# Patient Record
Sex: Male | Born: 1940 | Race: White | Hispanic: No | Marital: Married | Smoking: Never smoker
Health system: Southern US, Community
[De-identification: ages and names within clinical notes are randomized; demographics above are authoritative.]

## PROBLEM LIST (undated history)

## (undated) DIAGNOSIS — Z789 Other specified health status: Secondary | ICD-10-CM

## (undated) HISTORY — PX: PROSTATECTOMY: SHX69

## (undated) HISTORY — PX: HERNIA REPAIR: SHX51

---

## 2014-02-23 ENCOUNTER — Ambulatory Visit: Payer: Self-pay

## 2014-09-09 ENCOUNTER — Encounter
Admission: RE | Disposition: A | Discharge status: Home / Self Care | Payer: Self-pay | Source: Ambulatory Visit | Attending: Unknown Physician Specialty

## 2014-09-09 ENCOUNTER — Ambulatory Visit
Admission: RE | Admit: 2014-09-09 | Discharge: 2014-09-09 | Disposition: A | Discharge status: Home / Self Care | Payer: Medicare Other | Source: Ambulatory Visit | Attending: Unknown Physician Specialty | Admitting: Unknown Physician Specialty

## 2014-09-09 ENCOUNTER — Ambulatory Visit: Payer: Medicare Other | Admitting: Anesthesiology

## 2014-09-09 ENCOUNTER — Encounter: Payer: Self-pay | Admitting: *Deleted

## 2014-09-09 DIAGNOSIS — D125 Benign neoplasm of sigmoid colon: Secondary | ICD-10-CM | POA: Insufficient documentation

## 2014-09-09 DIAGNOSIS — Z86010 Personal history of colonic polyps: Secondary | ICD-10-CM | POA: Insufficient documentation

## 2014-09-09 DIAGNOSIS — Z09 Encounter for follow-up examination after completed treatment for conditions other than malignant neoplasm: Secondary | ICD-10-CM | POA: Diagnosis not present

## 2014-09-09 DIAGNOSIS — K640 First degree hemorrhoids: Secondary | ICD-10-CM | POA: Insufficient documentation

## 2014-09-09 DIAGNOSIS — Z7982 Long term (current) use of aspirin: Secondary | ICD-10-CM | POA: Diagnosis not present

## 2014-09-09 DIAGNOSIS — Z79899 Other long term (current) drug therapy: Secondary | ICD-10-CM | POA: Insufficient documentation

## 2014-09-09 HISTORY — PX: COLONOSCOPY WITH PROPOFOL: SHX5780

## 2014-09-09 SURGERY — COLONOSCOPY WITH PROPOFOL
Anesthesia: General

## 2014-09-09 MED ORDER — SODIUM CHLORIDE 0.9 % IV SOLN: INTRAVENOUS | Status: DC

## 2014-09-09 MED ORDER — PROPOFOL 10 MG/ML IV BOLUS
INTRAVENOUS | Status: DC | PRN
  Administered 2014-09-09: 30 mg via INTRAVENOUS

## 2014-09-09 MED ORDER — SODIUM CHLORIDE 0.9 % IV SOLN
INTRAVENOUS | Status: DC
  Administered 2014-09-09: 11:00:00 via INTRAVENOUS

## 2014-09-09 MED ORDER — FENTANYL CITRATE (PF) 100 MCG/2ML IJ SOLN
INTRAMUSCULAR | Status: DC | PRN
  Administered 2014-09-09: 50 ug via INTRAVENOUS

## 2014-09-09 MED ORDER — EPHEDRINE SULFATE 50 MG/ML IJ SOLN
INTRAMUSCULAR | Status: DC | PRN
  Administered 2014-09-09 (×2): 10 mg via INTRAVENOUS

## 2014-09-09 MED ORDER — LIDOCAINE HCL (PF) 2 % IJ SOLN
INTRAMUSCULAR | Status: DC | PRN
  Administered 2014-09-09: 50 mg

## 2014-09-09 MED ORDER — PROPOFOL INFUSION 10 MG/ML OPTIME
INTRAVENOUS | Status: DC | PRN
  Administered 2014-09-09: 120 ug/kg/min via INTRAVENOUS

## 2014-09-09 NOTE — H&P (Signed)
   Primary Care Physician:  Kirk Ruths., MD Primary Gastroenterologist:  Dr. Vira Agar  Pre-Procedure History & Physical: HPI:  Greogory Cornette is a 74 y.o. male is here for an colonoscopy.   No past medical history on file.  Past Surgical History  Procedure Laterality Date  . Hernia repair    . Prostatectomy      Prior to Admission medications   Medication Sig Start Date End Date Taking? Authorizing Provider  ascorbic acid (VITAMIN C) 1000 MG tablet Take 1,000 mg by mouth daily.   Yes Historical Provider, MD  aspirin EC 81 MG tablet Take 81 mg by mouth daily.   Yes Historical Provider, MD  Cholecalciferol (VITAMIN D3) 5000 UNITS CAPS Take by mouth.   Yes Historical Provider, MD  Cyanocobalamin (VITAMIN B 12 PO) Take by mouth.   Yes Historical Provider, MD  GARLIC PO Take 381 mg by mouth.   Yes Historical Provider, MD  Glucosamine Sulfate 1000 MG TABS Take by mouth.   Yes Historical Provider, MD  PLANT STANOL ESTER PO Take 450 mg by mouth.   Yes Historical Provider, MD    Allergies as of 08/03/2014  . (Not on File)    No family history on file.  Social History   Social History  . Marital Status: Married    Spouse Name: N/A  . Number of Children: N/A  . Years of Education: N/A   Occupational History  . Not on file.   Social History Main Topics  . Smoking status: Not on file  . Smokeless tobacco: Not on file  . Alcohol Use: Not on file  . Drug Use: Not on file  . Sexual Activity: Not on file   Other Topics Concern  . Not on file   Social History Narrative  . No narrative on file    Review of Systems: See HPI, otherwise negative ROS  Physical Exam: BP 130/91 mmHg  Pulse 65  Temp(Src) 97.1 F (36.2 C) (Tympanic)  Resp 20  Ht 5\' 10"  (1.778 m)  Wt 83.915 kg (185 lb)  BMI 26.54 kg/m2  SpO2 96% General:   Alert,  pleasant and cooperative in NAD Head:  Normocephalic and atraumatic. Neck:  Supple; no masses or thyromegaly. Lungs:  Clear throughout  to auscultation.    Heart:  Regular rate and rhythm. Abdomen:  Soft, nontender and nondistended. Normal bowel sounds, without guarding, and without rebound.   Neurologic:  Alert and  oriented x4;  grossly normal neurologically.  Impression/Plan: Otis Portal is here for an colonoscopy to be performed for [facility] colon polyps  Risks, benefits, limitations, and alternatives regarding  colonoscopy have been reviewed with the patient.  Questions have been answered.  All parties agreeable.   Gaylyn Cheers, MD  09/09/2014, 11:08 AM

## 2014-09-09 NOTE — Transfer of Care (Signed)
Immediate Anesthesia Transfer of Care Note  Patient: Jose Fowler  Procedure(s) Performed: Procedure(s): COLONOSCOPY WITH PROPOFOL (N/A)  Patient Location: PACU  Anesthesia Type:General  Level of Consciousness: sedated  Airway & Oxygen Therapy: Patient Spontanous Breathing and Patient connected to nasal cannula oxygen  Post-op Assessment: Report given to RN and Post -op Vital signs reviewed and stable  Post vital signs: Reviewed and stable  Last Vitals:  Filed Vitals:   09/09/14 1048  BP: 130/91  Pulse: 65  Temp: 36.2 C  Resp: 20    Complications: No apparent anesthesia complications

## 2014-09-09 NOTE — Op Note (Signed)
[facility] Patient Name: Jose Fowler Procedure Date: 09/09/2014 10:49 AM MRN: [id number] Account #: [id number] Date of Birth: [birth year] Admit Type: Outpatient Age: 74 Room: [facility] ENDO ROOM 1 Gender: Male Note Status: Finalized Procedure:         Colonoscopy Indications:       Personal history of colonic polyps Providers:         Manya Silvas, MD Referring MD:      Ocie Cornfield. Ouida Sills, MD (Referring MD) Medicines:         Propofol per Anesthesia Complications:     No immediate complications. Procedure:         Pre-Anesthesia Assessment:                    - After reviewing the risks and benefits, the patient was                     deemed in satisfactory condition to undergo the procedure.                    After obtaining informed consent, the colonoscope was                     passed under direct vision. Throughout the procedure, the                     patient's blood pressure, pulse, and oxygen saturations                     were monitored continuously. The Olympus PCF-H180AL                     colonoscope ( S#: [id number] ) was introduced through the                     anus and advanced to the the cecum, identified by                     appendiceal orifice and ileocecal valve. The colonoscopy                     was performed without difficulty. The patient tolerated                     the procedure well. The quality of the bowel preparation                     was excellent. Findings:      A diminutive polyp was found in the distal sigmoid colon. The polyp was       sessile. The polyp was removed with a jumbo cold forceps. Resection and       retrieval were complete.      Internal hemorrhoids were found during endoscopy. The hemorrhoids were       small and Grade I (internal hemorrhoids that do not prolapse).      A diffuse and patchy area of mildly scattered erythematous mucosal spots       was found in the rectum. Likely  related to prostate treatment in past.      The exam was otherwise without abnormality. Impression:        - One diminutive polyp in the distal sigmoid colon.  Resected and retrieved.                    - Internal hemorrhoids.                    - Erythematous mucosa in the rectum.                    - The examination was otherwise normal. Recommendation:    - Await pathology results. Manya Silvas, MD 09/09/2014 11:41:16 AM This report has been signed electronically. Number of Addenda: 0 Note Initiated On: 09/09/2014 10:49 AM Scope Withdrawal Time: 0 hours 13 minutes 1 second  Total Procedure Duration: 0 hours 22 minutes 33 seconds       [facility]

## 2014-09-10 ENCOUNTER — Encounter: Payer: Self-pay | Admitting: Unknown Physician Specialty

## 2014-09-10 LAB — SURGICAL PATHOLOGY

## 2014-09-10 NOTE — Anesthesia Preprocedure Evaluation (Signed)
Anesthesia Evaluation  Patient identified by MRN, date of birth, ID band Patient awake    Reviewed: Allergy & Precautions, H&P , NPO status , Patient's Chart, lab work & pertinent test results, reviewed documented beta blocker date and time   History of Anesthesia Complications Negative for: history of anesthetic complications  Airway Mallampati: I  TM Distance: >3 FB Neck ROM: full    Dental no notable dental hx. (+) Teeth Intact   Pulmonary neg pulmonary ROS,  breath sounds clear to auscultation  Pulmonary exam normal       Cardiovascular Exercise Tolerance: Good negative cardio ROS Normal cardiovascular examRhythm:regular Rate:Normal     Neuro/Psych negative neurological ROS  negative psych ROS   GI/Hepatic negative GI ROS, Neg liver ROS,   Endo/Other  negative endocrine ROS  Renal/GU negative Renal ROS  negative genitourinary   Musculoskeletal   Abdominal   Peds  Hematology negative hematology ROS (+)   Anesthesia Other Findings No past medical history on file.   Reproductive/Obstetrics negative OB ROS                             Anesthesia Physical Anesthesia Plan  ASA: I  Anesthesia Plan: General   Post-op Pain Management:    Induction:   Airway Management Planned:   Additional Equipment:   Intra-op Plan:   Post-operative Plan:   Informed Consent: I have reviewed the patients History and Physical, chart, labs and discussed the procedure including the risks, benefits and alternatives for the proposed anesthesia with the patient or authorized representative who has indicated his/her understanding and acceptance.   Dental Advisory Given  Plan Discussed with: Anesthesiologist, CRNA and Surgeon  Anesthesia Plan Comments:         Anesthesia Quick Evaluation

## 2014-09-10 NOTE — Anesthesia Postprocedure Evaluation (Signed)
  Anesthesia Post-op Note  Patient: Jose Fowler  Procedure(s) Performed: Procedure(s): COLONOSCOPY WITH PROPOFOL (N/A)  Anesthesia type:General  Patient location: PACU  Post pain: Pain level controlled  Post assessment: Post-op Vital signs reviewed, Patient's Cardiovascular Status Stable, Respiratory Function Stable, Patent Airway and No signs of Nausea or vomiting  Post vital signs: Reviewed and stable  Last Vitals:  Filed Vitals:   09/09/14 1210  BP: 131/85  Pulse: 68  Temp:   Resp: 14    Level of consciousness: awake, alert  and patient cooperative  Complications: No apparent anesthesia complications

## 2014-09-10 NOTE — Progress Notes (Signed)
Wife answered phone.  Patient still in bed but doing well post procedure.  Encouraged to call us back if she had any issues.

## 2015-05-30 ENCOUNTER — Emergency Department
Admission: EM | Admit: 2015-05-30 | Discharge: 2015-05-30 | Payer: Medicare Other | Attending: Emergency Medicine | Admitting: Emergency Medicine

## 2015-05-30 ENCOUNTER — Encounter: Payer: Self-pay | Admitting: Emergency Medicine

## 2015-05-30 ENCOUNTER — Emergency Department: Payer: Medicare Other

## 2015-05-30 DIAGNOSIS — S62609B Fracture of unspecified phalanx of unspecified finger, initial encounter for open fracture: Secondary | ICD-10-CM

## 2015-05-30 DIAGNOSIS — Z7982 Long term (current) use of aspirin: Secondary | ICD-10-CM | POA: Insufficient documentation

## 2015-05-30 DIAGNOSIS — Y9389 Activity, other specified: Secondary | ICD-10-CM | POA: Insufficient documentation

## 2015-05-30 DIAGNOSIS — Y999 Unspecified external cause status: Secondary | ICD-10-CM | POA: Diagnosis not present

## 2015-05-30 DIAGNOSIS — E785 Hyperlipidemia, unspecified: Secondary | ICD-10-CM | POA: Diagnosis not present

## 2015-05-30 DIAGNOSIS — S62633A Displaced fracture of distal phalanx of left middle finger, initial encounter for closed fracture: Secondary | ICD-10-CM | POA: Insufficient documentation

## 2015-05-30 DIAGNOSIS — Y929 Unspecified place or not applicable: Secondary | ICD-10-CM | POA: Diagnosis not present

## 2015-05-30 DIAGNOSIS — S62631A Displaced fracture of distal phalanx of left index finger, initial encounter for closed fracture: Secondary | ICD-10-CM | POA: Diagnosis not present

## 2015-05-30 DIAGNOSIS — S66812A Strain of other specified muscles, fascia and tendons at wrist and hand level, left hand, initial encounter: Secondary | ICD-10-CM

## 2015-05-30 DIAGNOSIS — S68119A Complete traumatic metacarpophalangeal amputation of unspecified finger, initial encounter: Secondary | ICD-10-CM

## 2015-05-30 DIAGNOSIS — Z79899 Other long term (current) drug therapy: Secondary | ICD-10-CM | POA: Diagnosis not present

## 2015-05-30 DIAGNOSIS — M66242 Spontaneous rupture of extensor tendons, left hand: Secondary | ICD-10-CM | POA: Diagnosis not present

## 2015-05-30 DIAGNOSIS — W268XXA Contact with other sharp object(s), not elsewhere classified, initial encounter: Secondary | ICD-10-CM | POA: Insufficient documentation

## 2015-05-30 DIAGNOSIS — S6992XA Unspecified injury of left wrist, hand and finger(s), initial encounter: Secondary | ICD-10-CM | POA: Diagnosis present

## 2015-05-30 LAB — BASIC METABOLIC PANEL
ANION GAP: 7 (ref 5–15)
BUN: 17 mg/dL (ref 6–20)
CO2: 29 mmol/L (ref 22–32)
Calcium: 9.9 mg/dL (ref 8.9–10.3)
Chloride: 102 mmol/L (ref 101–111)
Creatinine, Ser: 0.81 mg/dL (ref 0.61–1.24)
GFR calc Af Amer: >60 mL/min (ref >60)
GFR calc non Af Amer: >60 mL/min (ref >60)
GLUCOSE: 119 mg/dL — AB (ref 65–99)
POTASSIUM: 4.2 mmol/L (ref 3.5–5.1)
Sodium: 138 mmol/L (ref 135–145)

## 2015-05-30 LAB — CBC WITH DIFFERENTIAL/PLATELET
Basophils Absolute: 0 10*3/uL (ref 0–0.1)
Basophils Relative: 0 %
EOS PCT: 1 %
Eosinophils Absolute: 0 10*3/uL (ref 0–0.7)
HEMATOCRIT: 39.5 % — AB (ref 40.0–52.0)
Hemoglobin: 13.4 g/dL (ref 13.0–18.0)
LYMPHS PCT: 19 %
Lymphs Abs: 0.8 10*3/uL — ABNORMAL LOW (ref 1.0–3.6)
MCH: 30.9 pg (ref 26.0–34.0)
MCHC: 34 g/dL (ref 32.0–36.0)
MCV: 90.8 fL (ref 80.0–100.0)
MONO ABS: 0.2 10*3/uL (ref 0.2–1.0)
MONOS PCT: 6 %
NEUTROS ABS: 3 10*3/uL (ref 1.4–6.5)
Neutrophils Relative %: 74 %
PLATELETS: 126 10*3/uL — AB (ref 150–440)
RBC: 4.35 MIL/uL — ABNORMAL LOW (ref 4.40–5.90)
RDW: 13.7 % (ref 11.5–14.5)
WBC: 4.1 10*3/uL (ref 3.8–10.6)

## 2015-05-30 LAB — APTT: aPTT: 25 seconds (ref 24–36)

## 2015-05-30 LAB — PROTIME-INR
INR: 1.01
Prothrombin Time: 13.5 seconds (ref 11.4–15.0)

## 2015-05-30 MED ORDER — SODIUM CHLORIDE 0.9 % IV BOLUS (SEPSIS)
500.0000 mL | Freq: Once | INTRAVENOUS | Status: AC
  Administered 2015-05-30: 500 mL via INTRAVENOUS

## 2015-05-30 MED ORDER — CEFAZOLIN SODIUM-DEXTROSE 2-4 GM/100ML-% IV SOLN
2.0000 g | Freq: Once | INTRAVENOUS | Status: AC
  Administered 2015-05-30: 2 g via INTRAVENOUS
  Filled 2015-05-30: qty 100

## 2015-05-30 MED ORDER — HYDROMORPHONE HCL 1 MG/ML IJ SOLN
0.5000 mg | Freq: Once | INTRAMUSCULAR | Status: AC
  Administered 2015-05-30: 0.5 mg via INTRAVENOUS

## 2015-05-30 MED ORDER — HYDROMORPHONE HCL 1 MG/ML IJ SOLN
0.5000 mg | Freq: Once | INTRAMUSCULAR | Status: AC
  Administered 2015-05-30: 0.5 mg via INTRAVENOUS
  Filled 2015-05-30: qty 1

## 2015-05-30 MED ORDER — TETANUS-DIPHTH-ACELL PERTUSSIS 5-2.5-18.5 LF-MCG/0.5 IM SUSP
0.5000 mL | Freq: Once | INTRAMUSCULAR | Status: AC
  Administered 2015-05-30: 0.5 mL via INTRAMUSCULAR
  Filled 2015-05-30: qty 0.5

## 2015-05-30 MED ORDER — HYDROMORPHONE HCL 1 MG/ML IJ SOLN
INTRAMUSCULAR | Status: AC
  Administered 2015-05-30: 0.5 mg via INTRAVENOUS
  Filled 2015-05-30: qty 1

## 2015-05-30 NOTE — ED Notes (Signed)
Cut with table saw , partial finger amputation and multiple cuts, received at 1345.

## 2015-05-30 NOTE — ED Provider Notes (Signed)
[facility] Emergency Department Provider Note  ____________________________________________  Time seen: 3:15 PM  I have reviewed the triage vital signs and the nursing notes.   HISTORY  Chief Complaint Laceration    HPI Jose Fowler is a 75 y.o. male who sustained a table saw lacerations to his left hand fingers at about 1:45 PM. He was cutting a piece of wood with an ectopic, and the blade causing her to jerk suddenly pulling his fingers in front of the blade. He sustained partial amputation of the left ring finger and has multiple lacerations over the second and third fingers. Denies any other injuries. No hand pain or wrist pain.     History reviewed. No pertinent past medical history. Hyperlipidemia  There are no active problems to display for this patient.    Past Surgical History  Procedure Laterality Date  . Hernia repair    . Prostatectomy    . Colonoscopy with propofol N/A 09/09/2014    Procedure: COLONOSCOPY WITH PROPOFOL;  Surgeon: Manya Silvas, MD;  Location: [facility] ENDOSCOPY;  Service: Endoscopy;  Laterality: N/A;     Current Outpatient Rx  Name  Route  Sig  Dispense  Refill  . ascorbic acid (VITAMIN C) 1000 MG tablet   Oral   Take 1,000 mg by mouth daily.         Marland Kitchen aspirin EC 81 MG tablet   Oral   Take 81 mg by mouth daily.         . Cholecalciferol (VITAMIN D3) 5000 UNITS CAPS   Oral   Take by mouth.         . Cyanocobalamin (VITAMIN B 12 PO)   Oral   Take by mouth.         Marland Kitchen GARLIC PO   Oral   Take 600 mg by mouth.         . Glucosamine Sulfate 1000 MG TABS   Oral   Take by mouth.         Marland Kitchen PLANT STANOL ESTER PO   Oral   Take 450 mg by mouth.            Allergies Codeine; Imipramine; and Zoladex   No family history on file.  Social History Social History  Substance Use Topics  . Smoking status: Never Smoker   . Smokeless tobacco: None  . Alcohol Use: Yes    Review of  Systems  Constitutional:   No fever or chills.  Eyes:   No vision changes.  ENT:   No sore throat. No rhinorrhea. Cardiovascular:   No chest pain. Respiratory:   No dyspnea or cough. Gastrointestinal:   Negative for abdominal pain, vomiting and diarrhea.  Genitourinary:   Negative for dysuria or difficulty urinating. Musculoskeletal:   Severe pain in the left hand particularly the fourth finger but in the second and third fingers as well. Neurological:   Negative for headaches 10-point ROS otherwise negative.  ____________________________________________   PHYSICAL EXAM:  VITAL SIGNS: ED Triage Vitals  Enc Vitals Group     BP 05/30/15 1443 102/64 mmHg     Pulse Rate 05/30/15 1443 44     Resp 05/30/15 1443 18     Temp 05/30/15 1443 97.9 F (36.6 C)     Temp Source 05/30/15 1443 Oral     SpO2 05/30/15 1443 98 %     Weight 05/30/15 1443 180 lb (81.647 kg)     Height 05/30/15 1443 5\' 10"  (1.778 m)  Head Cir --      Peak Flow --      Pain Score 05/30/15 1443 10     Pain Loc --      Pain Edu? --      Excl. in [place]? --     Vital signs reviewed, nursing assessments reviewed.   Constitutional:   Alert and oriented. Well appearing and in no distress. Eyes:   No scleral icterus. No conjunctival pallor. PERRL. EOMI.  No nystagmus. ENT   Head:   Normocephalic and atraumatic.   Nose:   No congestion/rhinnorhea. No septal hematoma   Mouth/Throat:   MMM, no pharyngeal erythema. No peritonsillar mass.    Neck:   No stridor. No SubQ emphysema. No meningismus. Hematological/Lymphatic/Immunilogical:   No cervical lymphadenopathy. Cardiovascular:   RRR. Symmetric bilateral radial and DP pulses.  No murmurs.  Respiratory:   Normal respiratory effort without tachypnea nor retractions. Breath sounds are clear and equal bilaterally. No wheezes/rales/rhonchi. Gastrointestinal:   Soft and nontender. Non distended. There is no CVA tenderness.  No rebound, rigidity, or  guarding. Genitourinary:   deferred Musculoskeletal:   Left hand has an obvious partial amputation of the distal phalanx of the left fourth finger. There is exposed bone at the laceration service which is jagged and irregular. Bleeding is controlled. No other apparent injuries to the fourth finger, range of motion at the PIP joint appears to be intact. The left third finger has multiple lacerations over the proximal middle and distal phalanges. Nailbed is uninjured. There is extensor tendon weakness at the DIP joint. Left second finger has lacerations over the DIP joint and ulnar aspect of the nailbed with intact range of motion. Good distal perfusion in all fingers. The thumb and the fifth finger appear to be uninjured. Left upper extremity is uninjured above the wrist. Neurologic:   Normal speech and language.  CN 2-10 normal. Motor grossly intact. No gross focal neurologic deficits are appreciated.  Skin:    Multiple lacerations as above, no other injuries. ____________________________________________    LABS (pertinent positives/negatives) (all labs ordered are listed, but only abnormal results are displayed) Labs Reviewed  BASIC METABOLIC PANEL  CBC WITH DIFFERENTIAL/PLATELET  PROTIME-INR  APTT   ____________________________________________   EKG    ____________________________________________    RADIOLOGY  X-ray left hand reveals traumatic amputation of the fourth distal phalanx and a portion of the fourth middle phalanx with surrounding soft tissues. There does appear to be a remaining bone fragment of the still phalanx embedded in the soft tissues. Multiple fractures of the second middle and distal phalanges.  ____________________________________________   PROCEDURES CRITICAL CARE Performed by: Joni Fears, Edythe Riches   Total critical care time: 35 minutes  Critical care time was exclusive of separately billable procedures and treating other patients.  Critical care  was necessary to treat or prevent imminent or life-threatening deterioration.  Critical care was time spent personally by me on the following activities: development of treatment plan with patient and/or surrogate as well as nursing, discussions with consultants, evaluation of patient's response to treatment, examination of patient, obtaining history from patient or surrogate, ordering and performing treatments and interventions, ordering and review of laboratory studies, ordering and review of radiographic studies, pulse oximetry and re-evaluation of patient's condition.  ____________________________________________   INITIAL IMPRESSION / ASSESSMENT AND PLAN / ED COURSE  Pertinent labs & imaging results that were available during my care of the patient were reviewed by me and considered in my medical decision making (see chart  for details).  Patient presents with partial amputation of the left fourth finger as well as multiple open fractures of the second finger and likely third finger with also extensor tendon rupture of the third finger consistent with [place] finger. Multiple substantial traumatic injuries to the hand. Patient is without a concern that their HMO insurance may not cover care at [facility], but spoke with the representative myself and they assure [place] that for this acute care management especially since this requires specialized service that only do can provide, it would be covered so we'll proceed with transfer to [facility].  ----------------------------------------- 4:15 PM on 05/30/2015 -----------------------------------------  Case discussed with Dr. Jarold Song of [facility], except's for transfer, ED to ED via [facility].  Tdap have given, 2 g of Ancef IV, nothing by mouth.     ____________________________________________   FINAL CLINICAL IMPRESSION(S) / ED DIAGNOSES  Final diagnoses:  Amputation of finger of left hand with complication, initial encounter   Extensor tendon rupture of hand, left, initial encounter  Fracture of fingers, multiple sites, open, initial encounter       Portions of this note were generated with dragon dictation software. Dictation errors may occur despite best attempts at proofreading.   Carrie Mew, MD 05/30/15 [phone]

## 2015-06-17 ENCOUNTER — Ambulatory Visit: Payer: Medicare Other | Attending: Physician Assistant | Admitting: Occupational Therapy

## 2015-06-17 ENCOUNTER — Encounter: Payer: Self-pay | Admitting: Occupational Therapy

## 2015-06-17 DIAGNOSIS — S68625D 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, subsequent encounter: Secondary | ICD-10-CM | POA: Insufficient documentation

## 2015-06-17 DIAGNOSIS — R609 Edema, unspecified: Secondary | ICD-10-CM | POA: Diagnosis present

## 2015-06-17 DIAGNOSIS — R278 Other lack of coordination: Secondary | ICD-10-CM | POA: Diagnosis present

## 2015-06-17 DIAGNOSIS — S61209D Unspecified open wound of unspecified finger without damage to nail, subsequent encounter: Secondary | ICD-10-CM | POA: Diagnosis present

## 2015-06-17 DIAGNOSIS — S62621D Displaced fracture of medial phalanx of left index finger, subsequent encounter for fracture with routine healing: Secondary | ICD-10-CM | POA: Diagnosis not present

## 2015-06-17 DIAGNOSIS — R600 Localized edema: Secondary | ICD-10-CM

## 2015-06-17 DIAGNOSIS — X58XXXD Exposure to other specified factors, subsequent encounter: Secondary | ICD-10-CM | POA: Diagnosis not present

## 2015-06-17 DIAGNOSIS — M6281 Muscle weakness (generalized): Secondary | ICD-10-CM | POA: Diagnosis present

## 2015-06-17 NOTE — Therapy (Signed)
[place] [facility] [address] [place], [address] Phone: [phone]   Fax:  [phone]  Occupational Therapy Evaluation  Patient Details  Name: Jose Fowler MRN: [id number] Date of Birth: [birth year] Referring Provider: Dr Hassell Halim  Encounter Date: 06/17/2015      OT End of Session - 06/17/15 1258    Visit Number 1   Number of Visits 12   Date for OT Re-Evaluation 07/29/15   Authorization Type UHC Medicare   OT Start Time 1042   OT Stop Time 1129   OT Time Calculation (min) 47 min   Activity Tolerance Patient tolerated treatment well   Behavior During Therapy [facility] for tasks assessed/performed      History reviewed. No pertinent past medical history.  Past Surgical History  Procedure Laterality Date  . Hernia repair    . Prostatectomy    . Colonoscopy with propofol N/A 09/09/2014    Procedure: COLONOSCOPY WITH PROPOFOL;  Surgeon: Manya Silvas, MD;  Location: [facility] ENDOSCOPY;  Service: Endoscopy;  Laterality: N/A;    There were no vitals filed for this visit.      Subjective Assessment - 06/17/15 1047    Subjective  Pt reports that he was at home and cutting some wood when a piece of wood "broke off and I think I moved and turned and" and sustained L LF extensor tendon repair on 05/30/15; IF middle phalanx fracture and partial amputation  of RF DIP. He presents today in custom fabricated splint left hand as fabricated at [facility] for "Begin gentle AROM of RF PIP nd Index PIP, Hold off on index finger ROM".    Patient Stated Goals Pt states that his goal is to get use of left hand back, motion and functional use.    Currently in Pain? Yes   Pain Score 5    Pain Location Hand   Pain Orientation Left   Pain Descriptors / Indicators Throbbing   Pain Type Acute pain   Pain Onset 1 to 4 weeks ago   Pain Frequency Occasional   Aggravating Factors  bumping his hand; positioning straps   Pain Relieving  Factors Pain medication helps; positioning left hand    Multiple Pain Sites No           [facility] OT Assessment - 06/17/15 0001    Assessment   Diagnosis S/P table saw injury to left hand which occurred May 30, 2015. Injuries to left hand include distal phalanx amputation ring finger, open fracture base of middle phalanx long finger with extensor tendon laceration, open comminuted fracture middle phalanx and distal phalanx index finger. He was seen in ED of [facility] & transferred to [facility] on 05/30/15 & underwent revision amputation ring finger, wound repair and repair extensor tendon long finger, wound repair including nail bed repair index finger   Referring Provider Dr Hassell Halim   Onset Date 05/30/15   Prior Therapy Hand therapy at [facility] for fabrication of protective splint s/p extensor tendon repair, multiple fractures and partial amputation.   Precautions   Precautions Other (comment)  No AROM Left Index Finger DIP; seondary to nailbed /fracture   Required Braces or Orthoses Other Brace/Splint  Custom fabricated left resting pan splint at [facility]. MP's ~35*   Other Brace/Splint Protective splint at all times, removing only for wound care/dressing changes and home program as outlined today in clinic. No functional use left non-dominant hand at this time.   Pt/spouse verbalized understanding of  this in clinic today.   Home  Environment   Family/patient expects to be discharged to: Private residence   Type of [place]   Level of Independence Independent   Vocation Retired   Agricultural engineer, building wood scale models, reading.   Observation/Other Assessments   Observations Pt/spouse report indeendence w/ dressing changes & splint wear and care as previously fabricated and issued at [facility].   Other Surveys  Select   Quick DASH  61.4% disability  0% = no disability   Sensation   Light Touch Not tested  Secondary to dressings on left IF, MF, RF    Additional Comments Pt reports some phantom pain and paresthesias left RF noted   Coordination   Gross Motor Movements are Fluid and Coordinated No   Fine Motor Movements are Fluid and Coordinated No   Coordination and Movement Description No AROM or functional use left hand at this time secondary to surgical repairs/tendon/fracture healing.   Coordination Impaired/unable to assess at this time   Edema   Edema Moderate edema noted L dorsal hand/digits   ROM / Strength   AROM / PROM / Strength AROM   Left Hand AROM   L Index PIP 0-100 32 Degrees  Blocked 0-32* measured over gauze only, w/o finger dressing   L Index DIP 0-70 --  NT - Stressed NO ROM DIPJ Left IF   L Ring PIP 0-100 40 Degrees  Blocked ROM 0-40* measured over gauze (w/o finger sleeve)   L Ring DIP 0-70 --  N/A s/p amputation/revision                  OT Treatments/Exercises (OP) - 06/17/15 0001    ADLs   ADL Comments Pt/spouse were instructed in no functional use left hand at this time secondary to tendon repairs, fractures and wound healing. He was eduacted to wear prtective splint at all times and remove only as instructed by MD for dressing changes and wound care. Pt/spouse were educated in possible signs and symptoms of infection and were educated to call MD office immeadiately should he experience any of those symtpoms. Note,pt spouse is a retired Marine scientist and has wound care experience per their report.   ADL Education Given --  Do not use left hand for ADL's/IADL's at this time. No Use   Exercises   Exercises Hand   Hand Exercises   Joint Blocking Exercises Blocked flexion of PIP to RF and IF only. Stressed no ROM to IF DIP at this time 2* fracture/nailbed healing and No  ROM to LF as well secondary to extensor tendon healing/protocol.   Pt/spouse verbalized understanding in clinic at this time               OT Education - 06/17/15 1255    Education provided Yes   Education Details See patient  instruction. signs/symptoms of possible infection, active blocked PIP flexion to RF, IF. No AROM DIP of IF and no AROM LF. Wear splint inbetween dressing changes and ex's. No functional use left hand at this time. Monitor for IF extensor lag and d/c any exercises if you notice this.    Person(s) Educated Patient;Spouse   Methods Explanation;Demonstration;Verbal cues;Tactile cues;Handout   Comprehension Verbalized understanding;Verbal cues required;Returned demonstration;Tactile cues required          OT Short Term Goals - 06/17/15 1308    OT SHORT TERM GOAL #1   Title Pt will be  I protective splinting use, care and precautions left UE   Time 4   Period Weeks   Status New   OT SHORT TERM GOAL #2   Title Pt will be Mod I HEP as per tendon, fracture and wound healing left hand   Time 4   Period Weeks   Status New   OT SHORT TERM GOAL #3   Title Pt will be Mod I stating edema control techniques L hand   Period Weeks   Status New           OT Long Term Goals - 06/17/15 1310    OT LONG TERM GOAL #1   Title Pt will be Mod I upgraded HEP L hand   Time 8   Period Weeks   Status New   OT LONG TERM GOAL #2   Title Pt will demonstrate AROM for digital flexion, extension and composite flexion/extension WFL's (w/o extensor lag noted left hand).   Time 8   Period Weeks   Status New   OT LONG TERM GOAL #3   Title Pt will be Mod I left non-dominant hand use for ADL' and IADL's   Time 8   Period Weeks   Status New   OT LONG TERM GOAL #4   Title Pt will be mod I scar management and desensitization techniques left hand/fingers   Time 8   Period Weeks   Status New   OT LONG TERM GOAL #5   Title Pt will demonstrate left hand grip & pinch WFL's, as compared to right dominant hand, for return to daily activities.   Time 8   Period Weeks   Status New               Plan - 06/17/15 1259    Clinical Impression Statement Pt is a 75 y/o RHD male s/p  table saw injury to left hand  which occurred May 30, 2015. Injuries to left hand include distal phalanx amputation ring finger, open fracture base of middle phalanx long finger with extensor tendon laceration, open comminuted fracture middle phalanx and distal phalanx index finger. He was initially seen in ED of [facility] & transferred to [facility] on 05/30/15 & underwent revision amputation ring finger, wound repair and repair extensor tendon long finger, wound repair including nail bed repair index finger. He presents today for out-pt OT/hand therapy in custom fabricated splint left hand as made by therapist at [facility]. Splint is to be worn at all times and removed only for dressing changes, ex's as instructed today. He is avoid any functional use left hand. He should benefit from out-pt OT to address edema, need for pt/spouse education and progress program as per tendon, fracture, & wound healing .   Rehab Potential Good   OT Frequency 2x / week   OT Duration 8 weeks   OT Treatment/Interventions Self-care/ADL training;Therapeutic exercise;Patient/family education;Splinting;Manual Therapy;Ultrasound;Therapeutic exercises;Therapeutic activities;DME and/or AE instruction;Parrafin;Fluidtherapy;Scar mobilization;Passive range of motion;Moist Heat;Cryotherapy   Plan Review HEP left RF & IF; edema control, review precautions, dressing change PRN.   OT Home Exercise Plan See pt instructions   Consulted and Agree with Plan of Care Patient      Patient will benefit from skilled therapeutic intervention in order to improve the following deficits and impairments:  Decreased coordination, Decreased range of motion, Impaired flexibility, Impaired sensation, Increased edema, Decreased activity tolerance, Decreased skin integrity, Pain, Impaired UE functional use, Decreased scar mobility, Decreased mobility, Decreased strength  Visit Diagnosis: Open displaced fracture of  middle phalanx of left index finger, with routine healing, subsequent encounter - Plan: Ot  plan of care cert/re-cert  Open wound of finger with tendon injury, subsequent encounter - Plan: Ot plan of care cert/re-cert  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, subsequent encounter - Plan: Ot plan of care cert/re-cert  Edema of hand - Plan: Ot plan of care cert/re-cert  Other lack of coordination - Plan: Ot plan of care cert/re-cert  Muscle weakness (generalized) - Plan: Ot plan of care cert/re-cert      G-Codes - 0000000 1319    Functional Assessment Tool Used Quick DASH   Functional Limitation Self care   Self Care Current Status [id number]) At least 60 percent but less than 80 percent impaired, limited or restricted   Self Care Goal Status [id number]) At least 1 percent but less than 20 percent impaired, limited or restricted      Problem List There are no active problems to display for this patient.   Carlynn Herald, Taeler Winning Beth Dixon, OTR/L 06/17/2015, 1:29 PM  [place] [facility] [address] [place], [address] Phone: [phone]   Fax:  [phone]  Name: Cobey Caballeros MRN: [id number] Date of Birth: 11-24-1940

## 2015-06-17 NOTE — Patient Instructions (Addendum)
PIP Flexion (Active Blocked)  DO not grasp cap as shown in picture. Only do exercises as performed in clinic today. D/c any exercise to IF if you notice lag of DIP.  1) Hold large knuckle straight using other hand. Bend middle joint of _Ring____ finger as far as possible. Hold _3-5___ seconds. Repeat __5-8__ times. Do _2-3___ sessions per day.  2) Hold large knuckle straight using other hand. Bend middle joint of Index finger as far as possible (take care to NOT bend the tip joint of the index finger - leave it straight). Hold 3-4 seconds repeat 5-8 times 2-3 times per day.  Copyright  VHI. All rights reserved. 3) Reapply protective splint and wear at all times in between exercise sessions. Do not use you left hand for any activity at this time.

## 2015-06-21 ENCOUNTER — Encounter: Payer: Self-pay | Admitting: Occupational Therapy

## 2015-06-21 ENCOUNTER — Ambulatory Visit: Payer: Medicare Other | Admitting: Occupational Therapy

## 2015-06-21 DIAGNOSIS — R278 Other lack of coordination: Secondary | ICD-10-CM

## 2015-06-21 DIAGNOSIS — S61209D Unspecified open wound of unspecified finger without damage to nail, subsequent encounter: Secondary | ICD-10-CM

## 2015-06-21 DIAGNOSIS — S68625D 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, subsequent encounter: Secondary | ICD-10-CM

## 2015-06-21 DIAGNOSIS — M6281 Muscle weakness (generalized): Secondary | ICD-10-CM

## 2015-06-21 DIAGNOSIS — R600 Localized edema: Secondary | ICD-10-CM

## 2015-06-21 DIAGNOSIS — S62621D Displaced fracture of medial phalanx of left index finger, subsequent encounter for fracture with routine healing: Secondary | ICD-10-CM

## 2015-06-21 NOTE — Therapy (Signed)
[place] [facility] [address] [place], [address] Phone: [phone]   Fax:  [phone]  Occupational Therapy Treatment  Patient Details  Name: Jose Fowler MRN: [id number] Date of Birth: July 22, 1940 Referring Provider: Dr Hassell Halim  Encounter Date: 06/21/2015      OT End of Session - 06/21/15 0810    Visit Number 2   Number of Visits 12   Date for OT Re-Evaluation 07/29/15   Authorization Type UHC Medicare   OT Start Time 0730   OT Stop Time 0808   OT Time Calculation (min) 38 min   Activity Tolerance Patient tolerated treatment well   Behavior During Therapy [facility] for tasks assessed/performed      History reviewed. No pertinent past medical history.  Past Surgical History  Procedure Laterality Date  . Hernia repair    . Prostatectomy    . Colonoscopy with propofol N/A 09/09/2014    Procedure: COLONOSCOPY WITH PROPOFOL;  Surgeon: Manya Silvas, MD;  Location: [facility] ENDOSCOPY;  Service: Endoscopy;  Laterality: N/A;    There were no vitals filed for this visit.      Subjective Assessment - 06/21/15 0733    Subjective  Pt reports increased pain to left RF, phantom nerve pain @ 4/10. He also states "I need a review of the exercises"   Patient is accompained by: Family member  wife   Patient Stated Goals Pt states that his goal is to get use of left hand back, motion and functional use.    Currently in Pain? Yes   Pain Score 4    Pain Location Hand   Pain Orientation Left   Pain Descriptors / Indicators Throbbing;Burning   Pain Type Acute pain   Pain Onset 1 to 4 weeks ago   Pain Frequency Intermittent   Multiple Pain Sites No            [facility] OT Assessment - 06/21/15 0001    Left Hand AROM   L Index PIP 0-100 48 Degrees   L Ring PIP 0-100 66 Degrees   L Little  MCP 0-90 36 Degrees   L Little PIP 0-100 72 Degrees   L Little DIP 0-70 48 Degrees                  OT  Treatments/Exercises (OP) - 06/21/15 0001    Exercises   Exercises Hand   Hand Exercises   Joint Blocking Exercises Blocked flexion of PIP to RF and IF only. Stressed no ROM to IF DIP at this time 2* fracture/nailbed healing & to wear DIP exten splint as fabricated today while performing PIP flexion ex's. No  ROM to LF as well secondary to extensor tendon healing/protocol. Ex's performed x10 reps each Left small, (PIP, DIP, MP), RF PIP and IF PIP.  Added blocked flexion ex's to left small finger.   Splinting   Splinting Left IF custom DIP extension splint was fabricated for use during active PIP flexion exercises as pt is not to have DIP ROM at this time. Pt was educated in splinting use, care and precautions. He will wear only when doing HEP at this time.                OT Education - 06/21/15 0810    Education provided Yes   Education Details HEP, spmint wear and care & use of IF DIP extension splint while performing PIP blocked ex's   Person(s) Educated Patient;Spouse  Methods Explanation;Demonstration;Verbal cues;Tactile cues   Comprehension Verbalized understanding;Returned demonstration;Tactile cues required;Verbal cues required          OT Short Term Goals - 06/17/15 1308    OT SHORT TERM GOAL #1   Title Pt will be I protective splinting use, care and precautions left UE   Time 4   Period Weeks   Status New   OT SHORT TERM GOAL #2   Title Pt will be Mod I HEP as per tendon, fracture and wound healing left hand   Time 4   Period Weeks   Status New   OT SHORT TERM GOAL #3   Title Pt will be Mod I stating edema control techniques L hand   Period Weeks   Status New           OT Long Term Goals - 06/17/15 1310    OT LONG TERM GOAL #1   Title Pt will be Mod I upgraded HEP L hand   Time 8   Period Weeks   Status New   OT LONG TERM GOAL #2   Title Pt will demonstrate AROM for digital flexion, extension and composite flexion/extension WFL's (w/o extensor lag  noted left hand).   Time 8   Period Weeks   Status New   OT LONG TERM GOAL #3   Title Pt will be Mod I left non-dominant hand use for ADL' and IADL's   Time 8   Period Weeks   Status New   OT LONG TERM GOAL #4   Title Pt will be mod I scar management and desensitization techniques left hand/fingers   Time 8   Period Weeks   Status New   OT LONG TERM GOAL #5   Title Pt will demonstrate left hand grip & pinch WFL's, as compared to right dominant hand, for return to daily activities.   Time 8   Period Weeks   Status New               Plan - 06/21/15 [id number]    Clinical Impression Statement Pt should benefit from use of volar DIP extension splint L IF while performing blocked PIP exercises. Nice gains in active blocked ROM RF and IF PIP noted. Added blcoked ex's for small finger as per pt reports of joint stiffness today. Pt/spouse c/o nerve and phantom pain L RF and hand & he may benefit from  f/u w/ MD as he states "Pain meds, don't touch it"    Rehab Potential Good   OT Frequency 2x / week   OT Duration 8 weeks   OT Treatment/Interventions Self-care/ADL training;Therapeutic exercise;Patient/family education;Splinting;Manual Therapy;Ultrasound;Therapeutic exercises;Therapeutic activities;DME and/or AE instruction;Parrafin;Fluidtherapy;Scar mobilization;Passive range of motion;Moist Heat;Cryotherapy   Plan Review HEP and spint use, edema control.   OT Home Exercise Plan See pt instructions   Consulted and Agree with Plan of Care Patient      Patient will benefit from skilled therapeutic intervention in order to improve the following deficits and impairments:  Decreased coordination, Decreased range of motion, Impaired flexibility, Impaired sensation, Increased edema, Decreased activity tolerance, Decreased skin integrity, Pain, Impaired UE functional use, Decreased scar mobility, Decreased mobility, Decreased strength  Visit Diagnosis: Open displaced fracture of middle phalanx  of left index finger, with routine healing, subsequent encounter  Open wound of finger with tendon injury, subsequent encounter  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   Carlynn Herald, Vilda Zollner Beth Dixon, OTR/L 06/21/2015, 11:01 AM  [place] [facility] [address] [place], [address] Phone: [phone]   Fax:  [phone]  Name: Jose Fowler MRN: [id number] Date of Birth: 22-Feb-1940

## 2015-06-24 ENCOUNTER — Encounter: Payer: Self-pay | Admitting: Occupational Therapy

## 2015-06-24 ENCOUNTER — Ambulatory Visit: Payer: Medicare Other | Attending: Physician Assistant | Admitting: Occupational Therapy

## 2015-06-24 DIAGNOSIS — X58XXXD Exposure to other specified factors, subsequent encounter: Secondary | ICD-10-CM | POA: Insufficient documentation

## 2015-06-24 DIAGNOSIS — M6281 Muscle weakness (generalized): Secondary | ICD-10-CM | POA: Diagnosis present

## 2015-06-24 DIAGNOSIS — R609 Edema, unspecified: Secondary | ICD-10-CM | POA: Insufficient documentation

## 2015-06-24 DIAGNOSIS — R278 Other lack of coordination: Secondary | ICD-10-CM | POA: Diagnosis present

## 2015-06-24 DIAGNOSIS — S62621D Displaced fracture of medial phalanx of left index finger, subsequent encounter for fracture with routine healing: Secondary | ICD-10-CM

## 2015-06-24 DIAGNOSIS — M25642 Stiffness of left hand, not elsewhere classified: Secondary | ICD-10-CM | POA: Diagnosis present

## 2015-06-24 DIAGNOSIS — R600 Localized edema: Secondary | ICD-10-CM

## 2015-06-24 DIAGNOSIS — S68625D 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, subsequent encounter: Secondary | ICD-10-CM

## 2015-06-24 DIAGNOSIS — S61209D Unspecified open wound of unspecified finger without damage to nail, subsequent encounter: Secondary | ICD-10-CM | POA: Diagnosis present

## 2015-06-24 NOTE — Therapy (Signed)
[place] [facility] [address] [place], [address] Phone: [phone]   Fax:  [phone]  Occupational Therapy Treatment  Patient Details  Name: Jose Fowler MRN: [id number] Date of Birth: 1940/02/26 Referring Provider: Dr Hassell Halim  Encounter Date: 06/24/2015      OT End of Session - 06/24/15 0958    Visit Number 3   Number of Visits 12   Date for OT Re-Evaluation 07/29/15   Authorization Type [employer] Medicare   Authorization - Visit Number --  3/10 G   OT Start Time 0916   OT Stop Time 0947   OT Time Calculation (min) 31 min   Activity Tolerance Patient tolerated treatment well   Behavior During Therapy [facility] for tasks assessed/performed      History reviewed. No pertinent past medical history.  Past Surgical History  Procedure Laterality Date  . Hernia repair    . Prostatectomy    . Colonoscopy with propofol N/A 09/09/2014    Procedure: COLONOSCOPY WITH PROPOFOL;  Surgeon: Manya Silvas, MD;  Location: [facility] ENDOSCOPY;  Service: Endoscopy;  Laterality: N/A;    There were no vitals filed for this visit.      Subjective Assessment - 06/24/15 0928    Subjective  Pt reports decreased pain and independence in splinting use at L IF DIP joint while performing PIP flexion exercises.    Patient is accompained by: Family member  wife   Patient Stated Goals Pt states that his goal is to get use of left hand back, motion and functional use.    Currently in Pain? Yes   Pain Score 1    Pain Location Hand   Pain Orientation Left   Pain Descriptors / Indicators Aching   Pain Type Acute pain   Pain Onset 1 to 4 weeks ago   Pain Frequency Intermittent   Multiple Pain Sites No            [facility] OT Assessment - 06/24/15 0001    Left Hand AROM   L Index PIP 0-100 55 Degrees   L Ring PIP 0-100 66 Degrees   L Little  MCP 0-90 74 Degrees   L Little PIP 0-100 85 Degrees   L Little DIP 0-70 60 Degrees                  OT Treatments/Exercises (OP) - 06/24/15 0001    Exercises   Exercises Hand   Hand Exercises   Joint Blocking Exercises Blocked flexion of PIP to RF and IF only. Stressed no ROM to IF DIP at this time 2* fracture/nailbed healing & to wear DIP exten splint as fabricated today while performing PIP flexion ex's. No  ROM to MF as well secondary to extensor tendon healing/protocol. Ex's performed x10 reps each Left small, (PIP, DIP, MP), RF PIP and IF PIP.  Aal ex's performed x10 reps each.    Other Hand Exercises Added AROM to Left shoulder and elbow for flexion, extension and general AROM as pt tends to keep L UE in protected position.   Pt/spouse verbalized understanding in clinic today.   Other Hand Exercises Gentle blocked flexion ex's Left small finger today x10 reps each MCP, PIP, DIP by therapist in clinc  Blocked ROM asssessed after treatment today (Not AROM)   Splinting   Splinting Pt is I don/doff and use of splint L IF DIP as noted today in clinic during blocked PIP ex's   Manual  Therapy   Manual therapy comments Pt inquired about putting lotion on MF where he has developed healing scar w/ scabs. Pt was advised against this. Wound/scar healing was reviewed w/ pt/spouse. Pt spouse is retired Therapist, sports and verbalized understanding and agreement with this "I told him the same thing" per pr wife. Wound check - L IF ulnar side of DIP w/ sm area w/ serosanguinous (pin hole) drainage. All other digits are well healing, w/o signs of infection. Redressed with finger gauze & stockinette. Pt family is performing  dressing changes every other day. Reviewed signs and symptoms of possible infection.                OT Education - 06/24/15 0957    Education provided Yes   Education Details Reviewed HEP, splint wear and care & use of DIP extension splint while performing PIP blcoked ex's.   Person(s) Educated Patient;Spouse   Methods Explanation;Demonstration;Verbal cues    Comprehension Verbalized understanding;Returned demonstration          OT Short Term Goals - 06/17/15 1308    OT SHORT TERM GOAL #1   Title Pt will be I protective splinting use, care and precautions left UE   Time 4   Period Weeks   Status New   OT SHORT TERM GOAL #2   Title Pt will be Mod I HEP as per tendon, fracture and wound healing left hand   Time 4   Period Weeks   Status New   OT SHORT TERM GOAL #3   Title Pt will be Mod I stating edema control techniques L hand   Period Weeks   Status New           OT Long Term Goals - 06/17/15 1310    OT LONG TERM GOAL #1   Title Pt will be Mod I upgraded HEP L hand   Time 8   Period Weeks   Status New   OT LONG TERM GOAL #2   Title Pt will demonstrate AROM for digital flexion, extension and composite flexion/extension WFL's (w/o extensor lag noted left hand).   Time 8   Period Weeks   Status New   OT LONG TERM GOAL #3   Title Pt will be Mod I left non-dominant hand use for ADL' and IADL's   Time 8   Period Weeks   Status New   OT LONG TERM GOAL #4   Title Pt will be mod I scar management and desensitization techniques left hand/fingers   Time 8   Period Weeks   Status New   OT LONG TERM GOAL #5   Title Pt will demonstrate left hand grip & pinch WFL's, as compared to right dominant hand, for return to daily activities.   Time 8   Period Weeks   Status New               Plan - 06/24/15 1239    Clinical Impression Statement Pt has demonstrated nice gains in active blocked PIP flexion of RF and IF PIP joints, as well as, small finger active blocked MP, PIP and DIP flexion (as pt had been reporting stiffness of this unaffected finger). Pt also reports less pain as compared to previous visit. He should benefit from out-pt hand therapy to progress home program  as per extensor tendon healing/repair to MF (No AROM IF at this time); fractures of IF and DIP nailbed injury (No AROM DIP of IF) and  scar mamagement,  desensitization, active  blocked PIP flexion/extension of RF left non-dominant hand.   Rehab Potential Good   OT Frequency 2x / week   OT Duration 8 weeks   OT Treatment/Interventions Self-care/ADL training;Therapeutic exercise;Patient/family education;Splinting;Manual Therapy;Ultrasound;Therapeutic exercises;Therapeutic activities;DME and/or AE instruction;Parrafin;Fluidtherapy;Scar mobilization;Passive range of motion;Moist Heat;Cryotherapy   Plan Review HEP & splint use (IF volar DIP extension splint during active blocked PIP flexion) & active blocked PIP flexion to RF. No AROM L MF until cleared by [facility] MD 2* extensor tendon repair.  Edema control, pt/family ed. Scar management & desensitization as able and as per tendon, fracture and wound healing left hand.   OT Home Exercise Plan See pt instructions   Consulted and Agree with Plan of Care Patient;Family member/caregiver   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, sports      Patient will benefit from skilled therapeutic intervention in order to improve the following deficits and impairments:  Decreased coordination, Decreased range of motion, Impaired flexibility, Impaired sensation, Increased edema, Decreased activity tolerance, Decreased skin integrity, Pain, Impaired UE functional use, Decreased scar mobility, Decreased mobility, Decreased strength  Visit Diagnosis: Open displaced fracture of middle phalanx of left index finger, with routine healing, subsequent encounter  Open wound of finger with tendon injury, subsequent encounter  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   Carlynn Herald, Brayon Bielefeld Beth Dixon, OTR/L 06/24/2015, 12:53 PM  [place] [facility] [address], [place],  [address] Phone: [phone]   Fax:  [phone]  Name: Deyvon Stallard MRN: [id number] Date of Birth: [birth year]

## 2015-06-28 ENCOUNTER — Encounter: Payer: Medicare Other | Admitting: Occupational Therapy

## 2015-07-01 ENCOUNTER — Ambulatory Visit: Payer: Medicare Other | Admitting: Occupational Therapy

## 2015-07-01 DIAGNOSIS — S62621D Displaced fracture of medial phalanx of left index finger, subsequent encounter for fracture with routine healing: Secondary | ICD-10-CM

## 2015-07-01 DIAGNOSIS — R278 Other lack of coordination: Secondary | ICD-10-CM

## 2015-07-01 DIAGNOSIS — R600 Localized edema: Secondary | ICD-10-CM

## 2015-07-01 DIAGNOSIS — S61209D Unspecified open wound of unspecified finger without damage to nail, subsequent encounter: Secondary | ICD-10-CM

## 2015-07-01 DIAGNOSIS — M6281 Muscle weakness (generalized): Secondary | ICD-10-CM

## 2015-07-01 DIAGNOSIS — S68625D 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, subsequent encounter: Secondary | ICD-10-CM

## 2015-07-01 NOTE — Patient Instructions (Signed)
HEP cont but focus on PROM but pain less than 2/10  AROM and gentle PROM to 2nd PIP and MC  AROM and gentle PROM to 4th PIP and MC   composite flexion PROM for 5th  Place and hold attempted on 4th and 5th    pt to start some desentitization on 4th - where he can tolerate - some massage and soft textures  and then pt to do thin dressing and ed pt and wife _ and done in clinic gentle coban wrap from distal and proximal on 2nd , 3rd and 4th  Show understanding and precautions   To do 1 hr on and off , then increase on time hour at time to tolerance

## 2015-07-01 NOTE — Therapy (Signed)
[place] [facility] [address], [place], [address] Phone: [phone]   Fax:  [phone]  Occupational Therapy Treatment  Patient Details  Name: Jose Fowler MRN: [id number] Date of Birth: [birth year] Referring Provider: Dr Hassell Halim  Encounter Date: 07/01/2015      OT End of Session - 07/01/15 1106    Visit Number 4   Number of Visits 12   Date for OT Re-Evaluation 07/29/15   Authorization Type [employer] Medicare   OT Start Time 0955   OT Stop Time 1045   OT Time Calculation (min) 50 min   Activity Tolerance Patient tolerated treatment well   Behavior During Therapy [facility] for tasks assessed/performed      No past medical history on file.  Past Surgical History  Procedure Laterality Date  . Hernia repair    . Prostatectomy    . Colonoscopy with propofol N/A 09/09/2014    Procedure: COLONOSCOPY WITH PROPOFOL;  Surgeon: Manya Silvas, MD;  Location: [facility] ENDOSCOPY;  Service: Endoscopy;  Laterality: N/A;    There were no vitals filed for this visit.      Subjective Assessment - 07/01/15 1050    Subjective  Only pain in the tip of ring finger where amputation is - some shooting pain - but otherwise doing okay - doing dry dressings - no xerofoam anymore   Patient Stated Goals Pt states that his goal is to get use of left hand back, motion and functional use.    Currently in Pain? Yes   Pain Score 1    Pain Location Hand   Pain Orientation Left   Pain Descriptors / Indicators Aching   Pain Type Acute pain   Pain Onset 1 to 4 weeks ago   Pain Frequency Intermittent            Pt remove splints and dressings   AROM and gentle PROM to 2nd PIP and MC  AROM and gentle PROM to 4th PIP and MC   composite flexion PROM for 5th  Place and hold attempted on 4th and 5th    pt to start some desentitization on 4th - where he can tolerate - some massage and soft textures  and then pt to do thin dressing  and ed pt and wife _ and done in clinic gentle coban wrap from distal and proximal on 2nd , 3rd and 4th  Show understanding and precautions   To do 1 hr on and off , then increase on time hour at time to tolerance                   OT Education - 07/01/15 1106    Education provided Yes   Education Details HEP and coban wrap on digits   Person(s) Educated Patient;Spouse   Methods Explanation;Demonstration;Tactile cues;Verbal cues;Handout   Comprehension Verbal cues required;Returned demonstration;Verbalized understanding          OT Short Term Goals - 06/17/15 1308    OT SHORT TERM GOAL #1   Title Pt will be I protective splinting use, care and precautions left UE   Time 4   Period Weeks   Status New   OT SHORT TERM GOAL #2   Title Pt will be Mod I HEP as per tendon, fracture and wound healing left hand   Time 4   Period Weeks   Status New   OT SHORT TERM GOAL #3   Title Pt will be Mod  I stating edema control techniques L hand   Period Weeks   Status New           OT Long Term Goals - 06/17/15 1310    OT LONG TERM GOAL #1   Title Pt will be Mod I upgraded HEP L hand   Time 8   Period Weeks   Status New   OT LONG TERM GOAL #2   Title Pt will demonstrate AROM for digital flexion, extension and composite flexion/extension WFL's (w/o extensor lag noted left hand).   Time 8   Period Weeks   Status New   OT LONG TERM GOAL #3   Title Pt will be Mod I left non-dominant hand use for ADL' and IADL's   Time 8   Period Weeks   Status New   OT LONG TERM GOAL #4   Title Pt will be mod I scar management and desensitization techniques left hand/fingers   Time 8   Period Weeks   Status New   OT LONG TERM GOAL #5   Title Pt will demonstrate left hand grip & pinch WFL's, as compared to right dominant hand, for return to daily activities.   Time 8   Period Weeks   Status New               Plan - 07/01/15 1107    Clinical Impression Statement Pt show  edema in all digits - add gentle AAROM /PROM to 4th and 2nd PIP and MC's - and PROM to 5th - but only slight pull - also ed on light compression for edema- pt's wife was nurse and  verbalize understanding - not ROM yet to 3rd - until appt wth MD next Friday     Rehab Potential Good   OT Frequency 2x / week   OT Duration 6 weeks   OT Treatment/Interventions Self-care/ADL training;Therapeutic exercise;Patient/family education;Splinting;Manual Therapy;Ultrasound;Therapeutic exercises;Therapeutic activities;DME and/or AE instruction;Parrafin;Fluidtherapy;Scar mobilization;Passive range of motion;Moist Heat;Cryotherapy   Plan check on coban wrap and edema - assess progress in ROM - edema control    OT Home Exercise Plan See pt instructions   Consulted and Agree with Plan of Care Patient;Family member/caregiver   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, sports      Patient will benefit from skilled therapeutic intervention in order to improve the following deficits and impairments:  Decreased coordination, Decreased range of motion, Impaired flexibility, Impaired sensation, Increased edema, Decreased activity tolerance, Decreased skin integrity, Pain, Impaired UE functional use, Decreased scar mobility, Decreased mobility, Decreased strength  Visit Diagnosis: Open displaced fracture of middle phalanx of left index finger, with routine healing, subsequent encounter  Open wound of finger with tendon injury, subsequent encounter  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   Rosalyn Gess  OTR/L,CLT   07/01/2015, 11:14 AM  [place] [facility] [address], [place], [address] Phone: [phone]   Fax:  [phone]  Name: Jose Fowler MRN: [id number] Date of Birth: Oct 28, 1940

## 2015-07-07 ENCOUNTER — Ambulatory Visit: Payer: Medicare Other | Admitting: Occupational Therapy

## 2015-07-07 DIAGNOSIS — S62621D Displaced fracture of medial phalanx of left index finger, subsequent encounter for fracture with routine healing: Secondary | ICD-10-CM | POA: Diagnosis not present

## 2015-07-07 DIAGNOSIS — S68625D 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, subsequent encounter: Secondary | ICD-10-CM

## 2015-07-07 DIAGNOSIS — S61209D Unspecified open wound of unspecified finger without damage to nail, subsequent encounter: Secondary | ICD-10-CM

## 2015-07-07 DIAGNOSIS — R600 Localized edema: Secondary | ICD-10-CM

## 2015-07-07 NOTE — Therapy (Signed)
[place] [facility] [address] [place], [address] Phone: [phone]   Fax:  [phone]  Occupational Therapy Treatment  Patient Details  Name: Jose Fowler MRN: [id number] Date of Birth: 1940-12-15 Referring Provider: Dr Hassell Halim  Encounter Date: 07/07/2015      OT End of Session - 07/07/15 0834    Visit Number 5   Number of Visits 12   Date for OT Re-Evaluation 07/29/15   OT Start Time 0810   OT Stop Time 0900   OT Time Calculation (min) 50 min   Activity Tolerance Patient tolerated treatment well   Behavior During Therapy [facility] for tasks assessed/performed      No past medical history on file.  Past Surgical History  Procedure Laterality Date  . Hernia repair    . Prostatectomy    . Colonoscopy with propofol N/A 09/09/2014    Procedure: COLONOSCOPY WITH PROPOFOL;  Surgeon: Manya Silvas, MD;  Location: [facility] ENDOSCOPY;  Service: Endoscopy;  Laterality: N/A;    There were no vitals filed for this visit.      Subjective Assessment - 07/07/15 0832    Subjective  Not really pain - taking tylenol 3 x day - pain better , swelling better - that wrapping was helping - but I think my ring finger not as flexibilty    Patient Stated Goals Pt states that his goal is to get use of left hand back, motion and functional use.    Currently in Pain? No/denies            [facility] OT Assessment - 07/07/15 0001    Left Hand AROM   L Index PIP 0-100 55 Degrees   L Ring PIP 0-100 55 Degrees      Pt remove splints and dressings   assess AROM - swelling decreased but showed increase stiffness in 2nd and 4th, 5th   Contrast done 11 min - heat 3 min and cold 1 min x 2 and heat again 3 min  Add to HEP for stiffness and edema   PROM for 2nd , 4th and 5th  MC , PIP flexion  Blocked AROM  MC and PIP  Composite PROM flexion 5th , 4th - NO DIP flexion to 2nd  Place and hold attempted on 4th and 5th   pt to  start some desentitization on 4th - where he can tolerate - some massage and soft textures and then pt to do thin dressing and ed pt and wife again on  gentle coban wrap from distal and proximal on 2nd , 3rd and 4th  Show understanding and precautions   SHowed increase AROM and decrease edema at end of session  Pt to see surgeon tomorrow                     OT Education - 07/07/15 0834    Education provided Yes   Education Details HEP    Person(s) Educated Patient   Methods Explanation;Demonstration;Tactile cues;Verbal cues   Comprehension Verbal cues required;Returned demonstration;Verbalized understanding          OT Short Term Goals - 06/17/15 1308    OT SHORT TERM GOAL #1   Title Pt will be I protective splinting use, care and precautions left UE   Time 4   Period Weeks   Status New   OT SHORT TERM GOAL #2   Title Pt will be Mod I HEP as per tendon, fracture  and wound healing left hand   Time 4   Period Weeks   Status New   OT SHORT TERM GOAL #3   Title Pt will be Mod I stating edema control techniques L hand   Period Weeks   Status New           OT Long Term Goals - 06/17/15 1310    OT LONG TERM GOAL #1   Title Pt will be Mod I upgraded HEP L hand   Time 8   Period Weeks   Status New   OT LONG TERM GOAL #2   Title Pt will demonstrate AROM for digital flexion, extension and composite flexion/extension WFL's (w/o extensor lag noted left hand).   Time 8   Period Weeks   Status New   OT LONG TERM GOAL #3   Title Pt will be Mod I left non-dominant hand use for ADL' and IADL's   Time 8   Period Weeks   Status New   OT LONG TERM GOAL #4   Title Pt will be mod I scar management and desensitization techniques left hand/fingers   Time 8   Period Weeks   Status New   OT LONG TERM GOAL #5   Title Pt will demonstrate left hand grip & pinch WFL's, as compared to right dominant hand, for return to daily activities.   Time 8   Period Weeks    Status New               Plan - 07/07/15 1641    Clinical Impression Statement Pt showed decrease edema in digits since using coban wraps since last time - but showed some decrease ROM in 2nd , 4th and 5th - done contrast in clinic and pt showed increase AROM - and PROM - with less pain - add to HEP - pt to see MD tomorrow    Rehab Potential Good   OT Frequency 2x / week   OT Treatment/Interventions Self-care/ADL training;Therapeutic exercise;Patient/family education;Splinting;Manual Therapy;Ultrasound;Therapeutic exercises;Therapeutic activities;DME and/or AE instruction;Parrafin;Fluidtherapy;Scar mobilization;Passive range of motion;Moist Heat;Cryotherapy   Plan how appt with MD went - if new orders - can start AROM to 3rd digit ?   OT Home Exercise Plan See pt instructions   Consulted and Agree with Plan of Care Patient;Family member/caregiver   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, sports      Patient will benefit from skilled therapeutic intervention in order to improve the following deficits and impairments:  Decreased coordination, Decreased range of motion, Impaired flexibility, Impaired sensation, Increased edema, Decreased activity tolerance, Decreased skin integrity, Pain, Impaired UE functional use, Decreased scar mobility, Decreased mobility, Decreased strength  Visit Diagnosis: Open displaced fracture of middle phalanx of left index finger, with routine healing, subsequent encounter  Open wound of finger with tendon injury, subsequent encounter  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   Rosalyn Gess OTR/L,CLT  07/07/2015, 4:44 PM  [place] [facility] [address] [place], [address] Phone: [phone]   Fax:  [phone]  Name: Jose Fowler MRN:  [id number] Date of Birth: [birth year]

## 2015-07-07 NOTE — Patient Instructions (Signed)
Pt to add contrast to HEP - prior to ROM - use heatingpad and cold pack

## 2015-07-11 ENCOUNTER — Ambulatory Visit: Payer: Medicare Other | Admitting: Occupational Therapy

## 2015-07-11 DIAGNOSIS — S68625D 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, subsequent encounter: Secondary | ICD-10-CM

## 2015-07-11 DIAGNOSIS — S61209D Unspecified open wound of unspecified finger without damage to nail, subsequent encounter: Secondary | ICD-10-CM

## 2015-07-11 DIAGNOSIS — S62621D Displaced fracture of medial phalanx of left index finger, subsequent encounter for fracture with routine healing: Secondary | ICD-10-CM

## 2015-07-11 DIAGNOSIS — R600 Localized edema: Secondary | ICD-10-CM

## 2015-07-11 DIAGNOSIS — M6281 Muscle weakness (generalized): Secondary | ICD-10-CM

## 2015-07-11 DIAGNOSIS — R278 Other lack of coordination: Secondary | ICD-10-CM

## 2015-07-11 NOTE — Patient Instructions (Signed)
Add PROM gentle for 3rd at PIP and DIP  And gentle PROM to 2nd DIP  Per MD order  Composite flexion PROM add to each digits  Tendon glides - with composite fist - to 4 cm foam block  Opposition 2 cm foam block - alternate digits  Scar massage to scars that was healed - Hand out provided  Did add PROM for wrist extention  and AROM to wrist in all planes

## 2015-07-11 NOTE — Therapy (Signed)
[place] [facility] [address], [place], [address] Phone: [phone]   Fax:  [phone]  Occupational Therapy Treatment  Patient Details  Name: Jose Fowler MRN: [id number] Date of Birth: [birth year] Referring Provider: Dr Hassell Halim  Encounter Date: 07/11/2015      OT End of Session - 07/11/15 1352    Visit Number 6   Number of Visits 12   Date for OT Re-Evaluation 07/29/15   Authorization Type [employer] Medicare   OT Start Time 1036   OT Stop Time 1140   OT Time Calculation (min) 64 min   Activity Tolerance Patient tolerated treatment well   Behavior During Therapy [facility] for tasks assessed/performed      No past medical history on file.  Past Surgical History  Procedure Laterality Date  . Hernia repair    . Prostatectomy    . Colonoscopy with propofol N/A 09/09/2014    Procedure: COLONOSCOPY WITH PROPOFOL;  Surgeon: Manya Silvas, MD;  Location: [facility] ENDOSCOPY;  Service: Endoscopy;  Laterality: N/A;    There were no vitals filed for this visit.      Subjective Assessment - 07/11/15 1056    Subjective  Seen Dr,  was very happy - no splint except tip of index - no restrictions - can do PROM and AROM - and use some what - my wrist is little sore and thumb - did not take all the stitches out    Patient Stated Goals Pt states that his goal is to get use of left hand back, motion and functional use.    Currently in Pain? Yes   Pain Score 1    Pain Location Hand   Pain Orientation Left   Pain Descriptors / Indicators Aching   Pain Type Acute pain     Dressings removed - and did some scar management - taking some scabs off and ed on scar massage - still some areas that has thick scabs -  Also can hold off some on coban wrap during day if not increase edema - that can encourage more AROM  -and then coban at night time   PROM for each joint and digits  As well as composite flexion - including this date   3rd and DIP of 2nd per MD order  per MD order Pt ed on HEP and reviewed with wife and pt again  Tendon glides - block wrist  And then MC's during intrinsic - needed some Mod A  Composite AROM fist to 4 cm foam block    Opposition 2 cm foam block - alternate digits  Prayer stretch for wrist extention    AROM to wrist in all planes with elbow to side or over arm rest  Needed mod A for ther ex and changes                         OT Education - 07/11/15 1352    Education provided Yes   Education Details UPdated HEP - see pt instruction    Person(s) Educated Patient   Methods Explanation;Tactile cues;Demonstration;Verbal cues;Handout   Comprehension Verbal cues required;Returned demonstration;Verbalized understanding          OT Short Term Goals - 06/17/15 1308    OT SHORT TERM GOAL #1   Title Pt will be I protective splinting use, care and precautions left UE   Time 4   Period Weeks   Status New  OT SHORT TERM GOAL #2   Title Pt will be Mod I HEP as per tendon, fracture and wound healing left hand   Time 4   Period Weeks   Status New   OT SHORT TERM GOAL #3   Title Pt will be Mod I stating edema control techniques L hand   Period Weeks   Status New           OT Long Term Goals - 06/17/15 1310    OT LONG TERM GOAL #1   Title Pt will be Mod I upgraded HEP L hand   Time 8   Period Weeks   Status New   OT LONG TERM GOAL #2   Title Pt will demonstrate AROM for digital flexion, extension and composite flexion/extension WFL's (w/o extensor lag noted left hand).   Time 8   Period Weeks   Status New   OT LONG TERM GOAL #3   Title Pt will be Mod I left non-dominant hand use for ADL' and IADL's   Time 8   Period Weeks   Status New   OT LONG TERM GOAL #4   Title Pt will be mod I scar management and desensitization techniques left hand/fingers   Time 8   Period Weeks   Status New   OT LONG TERM GOAL #5   Title Pt will demonstrate left hand grip  & pinch WFL's, as compared to right dominant hand, for return to daily activities.   Time 8   Period Weeks   Status New               Plan - 07/11/15 1353    Clinical Impression Statement Pt making progress- had most of stitches removed last week - pt brought new order in for PROM and  AROM to all digis - not splint except for 2nd DIP - for protection - intiated this date PROM and AROM to all jionts - as well as wirst because of some soreness and stiffnes from being in  resting hand splint for 6 wks    Rehab Potential Good   OT Frequency 2x / week   OT Duration 4 weeks   OT Treatment/Interventions Self-care/ADL training;Therapeutic exercise;Patient/family education;Splinting;Manual Therapy;Ultrasound;Therapeutic exercises;Therapeutic activities;DME and/or AE instruction;Parrafin;Fluidtherapy;Scar mobilization;Passive range of motion;Moist Heat;Cryotherapy   Plan assess how HEP - measure AROM coming in - progress with HEP - and scars   OT Home Exercise Plan See pt instructions   Consulted and Agree with Plan of Care Patient;Family member/caregiver   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, sports      Patient will benefit from skilled therapeutic intervention in order to improve the following deficits and impairments:  Decreased coordination, Decreased range of motion, Impaired flexibility, Impaired sensation, Increased edema, Decreased activity tolerance, Decreased skin integrity, Pain, Impaired UE functional use, Decreased scar mobility, Decreased mobility, Decreased strength  Visit Diagnosis: Open displaced fracture of middle phalanx of left index finger, with routine healing, subsequent encounter  Open wound of finger with tendon injury, subsequent encounter  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   Rosalyn Gess OTR/L,CLT  07/11/2015, 1:57 PM  [place] [facility] [address] [place], [address] Phone: [phone]   Fax:  [phone]  Name: Jose Fowler MRN: [id number] Date  of Birth: [birth year]

## 2015-07-12 ENCOUNTER — Encounter: Payer: Medicare Other | Admitting: Occupational Therapy

## 2015-07-14 ENCOUNTER — Ambulatory Visit: Payer: Medicare Other | Admitting: Occupational Therapy

## 2015-07-14 DIAGNOSIS — S62621D Displaced fracture of medial phalanx of left index finger, subsequent encounter for fracture with routine healing: Secondary | ICD-10-CM

## 2015-07-14 DIAGNOSIS — M6281 Muscle weakness (generalized): Secondary | ICD-10-CM

## 2015-07-14 DIAGNOSIS — M25642 Stiffness of left hand, not elsewhere classified: Secondary | ICD-10-CM

## 2015-07-14 DIAGNOSIS — R600 Localized edema: Secondary | ICD-10-CM

## 2015-07-14 DIAGNOSIS — S68625D 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, subsequent encounter: Secondary | ICD-10-CM

## 2015-07-14 DIAGNOSIS — S61209D Unspecified open wound of unspecified finger without damage to nail, subsequent encounter: Secondary | ICD-10-CM

## 2015-07-14 DIAGNOSIS — R278 Other lack of coordination: Secondary | ICD-10-CM

## 2015-07-14 NOTE — Therapy (Signed)
[place] [facility] [address] [place], [address] Phone: [phone]   Fax:  [phone]  Occupational Therapy Treatment  Patient Details  Name: Jose Fowler MRN: [id number] Date of Birth: 11-18-1940 Referring Provider: Dr Hassell Halim  Encounter Date: 07/14/2015      OT End of Session - 07/14/15 1428    Visit Number 7   Number of Visits 12   Date for OT Re-Evaluation 07/29/15   Authorization Type [employer] Medicare   OT Start Time 1150   OT Stop Time 1244   OT Time Calculation (min) 54 min   Activity Tolerance Patient tolerated treatment well   Behavior During Therapy [facility] for tasks assessed/performed      No past medical history on file.  Past Surgical History  Procedure Laterality Date  . Hernia repair    . Prostatectomy    . Colonoscopy with propofol N/A 09/09/2014    Procedure: COLONOSCOPY WITH PROPOFOL;  Surgeon: Manya Silvas, MD;  Location: [facility] ENDOSCOPY;  Service: Endoscopy;  Laterality: N/A;    There were no vitals filed for this visit.      Subjective Assessment - 07/14/15 1425    Subjective  Did not had good night last night - pain in fingers and where the amputation of 4th is  feels SOOO tight - some more scabs come off    Patient Stated Goals Pt states that his goal is to get use of left hand back, motion and functional use.    Currently in Pain? Yes   Pain Score 5    Pain Location Finger (Comment which one)   Pain Orientation Left   Pain Descriptors / Indicators Aching;Shooting;Stabbing;Tightness   Pain Type Surgical pain   Pain Onset 1 to 4 weeks ago                      OT Treatments/Exercises (OP) - 07/14/15 0001    LUE Contrast Bath   Time 11 minutes   Comments after scars and incision was checked to decrease edema and pain - increase ROM      Pt arrive with no bandages on digits   PROM for each joint and digits  As well as composite flexion - including  this date 3rd and DIP of 2nd per MD order  per MD order Pt ed on HEP and reviewed with wife and pt again  - but not force while edema increase this date  Tendon glides - block wrist  And then MC's during intrinsic - needed some Mod A  Composite AROM fist to 4 cm foam block    Opposition 2 cm foam block - alternate digits  Prayer stretch for wrist extention   AROM to wrist in all planes with elbow to side or over arm rest  Needed mod A for ther ex and changes   DOne thin dressing over scabs , .5 cm open area on DIP of 3rd , bandaid- and ed pt and wife on applying coban correctly and not pressure  - cone  in clinic gentle coban wrap from distal and proximal on 2nd , 3rd and 4th  - with angle - to decrease edema ad pain  Pt report pain decreaseto 0-1/10 compare to 5/10 comingin  Show understanding and precautions                   OT Education - 07/14/15 1426    Education provided Yes  Education Details HEP and changes   Person(s) Educated Patient;Spouse   Methods Explanation;Demonstration;Tactile cues;Verbal cues   Comprehension Verbalized understanding;Returned demonstration;Verbal cues required          OT Short Term Goals - 06/17/15 1308    OT SHORT TERM GOAL #1   Title Pt will be I protective splinting use, care and precautions left UE   Time 4   Period Weeks   Status New   OT SHORT TERM GOAL #2   Title Pt will be Mod I HEP as per tendon, fracture and wound healing left hand   Time 4   Period Weeks   Status New   OT SHORT TERM GOAL #3   Title Pt will be Mod I stating edema control techniques L hand   Period Weeks   Status New           OT Long Term Goals - 06/17/15 1310    OT LONG TERM GOAL #1   Title Pt will be Mod I upgraded HEP L hand   Time 8   Period Weeks   Status New   OT LONG TERM GOAL #2   Title Pt will demonstrate AROM for digital flexion, extension and composite flexion/extension WFL's (w/o extensor lag noted left hand).    Time 8   Period Weeks   Status New   OT LONG TERM GOAL #3   Title Pt will be Mod I left non-dominant hand use for ADL' and IADL's   Time 8   Period Weeks   Status New   OT LONG TERM GOAL #4   Title Pt will be mod I scar management and desensitization techniques left hand/fingers   Time 8   Period Weeks   Status New   OT LONG TERM GOAL #5   Title Pt will demonstrate left hand grip & pinch WFL's, as compared to right dominant hand, for return to daily activities.   Time 8   Period Weeks   Status New               Plan - 07/14/15 1428    Clinical Impression Statement Pt present this date with increase pain - and edema in digist - with decrease AROM and PROM - pt to go back and do coban wrap on digits inbetween HEP - to decrease edema , decreaes pain and increase ROM - pt pain decrease at end of session to 1/10 -  3rd limited this date- and still some scabs on 2nd and 3rd , as wel las  tip of 4th -     Rehab Potential Good   OT Frequency 2x / week   OT Duration 4 weeks   OT Treatment/Interventions Self-care/ADL training;Therapeutic exercise;Patient/family education;Splinting;Manual Therapy;Ultrasound;Therapeutic exercises;Therapeutic activities;DME and/or AE instruction;Parrafin;Fluidtherapy;Scar mobilization;Passive range of motion;Moist Heat;Cryotherapy   Plan assess pain , edema , if increase AROM in digits - and tissue healing    OT Home Exercise Plan See pt instructions   Consulted and Agree with Plan of Care Patient   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, sports      Patient will benefit from skilled therapeutic intervention in order to improve the following deficits and impairments:  Decreased coordination, Decreased range of motion, Impaired flexibility, Impaired sensation, Increased edema, Decreased activity tolerance, Decreased skin integrity, Pain, Impaired UE functional use, Decreased scar mobility, Decreased mobility, Decreased strength  Visit Diagnosis: Open  displaced fracture of middle phalanx of left index finger, with routine healing, subsequent encounter  Open wound of  finger with tendon injury, subsequent encounter  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   Rosalyn Gess OTR/L,CLT  07/14/2015, 2:32 PM  [place] [facility] [address] [place], [address] Phone: [phone]   Fax:  [phone]  Name: Jose Fowler MRN: [id number] Date of Birth: 07-15-1940

## 2015-07-14 NOTE — Patient Instructions (Signed)
Same HEP for ROM  But do compression wraps with coban again - to decrease  edema and decrease pain  And do contrast prior

## 2015-07-18 ENCOUNTER — Ambulatory Visit: Payer: Medicare Other | Admitting: Occupational Therapy

## 2015-07-18 DIAGNOSIS — R278 Other lack of coordination: Secondary | ICD-10-CM

## 2015-07-18 DIAGNOSIS — M25642 Stiffness of left hand, not elsewhere classified: Secondary | ICD-10-CM

## 2015-07-18 DIAGNOSIS — S61209D Unspecified open wound of unspecified finger without damage to nail, subsequent encounter: Secondary | ICD-10-CM

## 2015-07-18 DIAGNOSIS — S62621D Displaced fracture of medial phalanx of left index finger, subsequent encounter for fracture with routine healing: Secondary | ICD-10-CM

## 2015-07-18 DIAGNOSIS — M6281 Muscle weakness (generalized): Secondary | ICD-10-CM

## 2015-07-18 DIAGNOSIS — S68625D 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, subsequent encounter: Secondary | ICD-10-CM

## 2015-07-18 DIAGNOSIS — R600 Localized edema: Secondary | ICD-10-CM

## 2015-07-18 NOTE — Patient Instructions (Signed)
Pt to increase HEP to  5 x day  Cont to coban  Cont with same HEP for AROM  But add  And composite stretch with heating pad for flexion 3 min 3 x of the sessions instead of contrast PROM to 2 cm foam block by wife- hold 15 -30 sec x 4  And then place and hold to 2 cm foam block by wife   8 reps

## 2015-07-18 NOTE — Therapy (Signed)
[place] [facility] [address], [place], [address] Phone: [phone]   Fax:  [phone]  Occupational Therapy Treatment  Patient Details  Name: Jose Fowler MRN: [id number] Date of Birth: [birth year] Referring Provider: Dr Hassell Halim  Encounter Date: 07/18/2015      OT End of Session - 07/18/15 1311    Visit Number 8   Number of Visits 12   Date for OT Re-Evaluation 07/29/15   Authorization Type [employer] Medicare   OT Start Time 1133   OT Stop Time 1236   OT Time Calculation (min) 63 min   Activity Tolerance Patient tolerated treatment well   Behavior During Therapy [facility] for tasks assessed/performed      No past medical history on file.  Past Surgical History  Procedure Laterality Date  . Hernia repair    . Prostatectomy    . Colonoscopy with propofol N/A 09/09/2014    Procedure: COLONOSCOPY WITH PROPOFOL;  Surgeon: Manya Silvas, MD;  Location: [facility] ENDOSCOPY;  Service: Endoscopy;  Laterality: N/A;    There were no vitals filed for this visit.      Subjective Assessment - 07/18/15 1151    Subjective  Pain better - did some bandaging - and more tight feeling - pain at open area about 3/10- sleeping better - doing exercises   Patient Stated Goals Pt states that his goal is to get use of left hand back, motion and functional use.    Currently in Pain? Yes   Pain Score 3    Pain Location Finger (Comment which one)   Pain Orientation Left   Pain Descriptors / Indicators Tightness;Burning   Pain Type Surgical pain            [facility] OT Assessment - 07/18/15 0001    Left Hand AROM   L Index  MCP 0-90 75 Degrees   L Index PIP 0-100 62 Degrees   L Long  MCP 0-90 75 Degrees   L Long PIP 0-100 45 Degrees   L Ring  MCP 0-90 85 Degrees   L Ring PIP 0-100 65 Degrees   L Little  MCP 0-90 90 Degrees   L Little PIP 0-100 65 Degrees       Pt arrive with  bandages on digits  ROM very stiff in all  digits AROM to 4cm foam block and intrinsic fist blocked - 20 reps each  Then measurements taken   see flow sheet  Contrast done with streth into gentle flexion of digits in heat   PROM for each joint and digits to 4 cm foam block  PROM to 2 cm foam block   As well as composite flexion to 2 cm foam block -but need place and hold  Pt ed on HEP and reviewed with wife ( that practice the passive stretch and place andhold to 2 cm foam block )   Measurements taken and increase to 62 -80 at PIP's at  End      Opposition  To digits - need to focus  alternate digits and oval - digits need to meet thumb    DOne thin dressing over scabs ,  gentle coban wrap from distal and proximal on 2nd , 3rd and 4th  - with angle - to decrease edema ad pain  Pt report pain decreaseto  Show understanding and precautions   Pt to increase HEP to  5 x day  Cont to coban  Cont with  same HEP for AROM  But add  And composite stretch with heating pad for flexion 3 min 3 x of the sessions instead of contrast PROM to 2 cm foam block by wife- hold 15 -30 sec x 4  And then place and hold to 2 cm foam block by wife   8 reps                      OT Education - 07/18/15 1156    Education provided Yes   Education Details  HEP upadate    Person(s) Educated Patient;Spouse   Methods Explanation;Demonstration;Tactile cues;Verbal cues;Handout   Comprehension Verbal cues required;Returned demonstration;Verbalized understanding          OT Short Term Goals - 07/18/15 1314    OT SHORT TERM GOAL #1   Title Pt will be I protective splinting use, care and precautions left UE   Status Achieved   OT SHORT TERM GOAL #2   Title Pt will be Mod I HEP as per tendon, fracture and wound healing left hand   Status Achieved   OT SHORT TERM GOAL #3   Title Pt will be Mod I stating edema control techniques L hand   Baseline coban still - cannot use digisleeves - still scabs and one open area on 3rd DIP     Time 3   Period Weeks   Status On-going           OT Long Term Goals - 07/18/15 1314    OT LONG TERM GOAL #1   Title Pt will be Mod I upgraded HEP L hand   Status Achieved   OT LONG TERM GOAL #2   Title Pt will demonstrate AROM for digital flexion, extension and composite flexion/extension WFL's (w/o extensor lag noted left hand).   Baseline progressing - see flowsheet    Time 3   Period Weeks   Status On-going   OT LONG TERM GOAL #3   Title Pt will be Mod I left non-dominant hand use for ADL' and IADL's   Status Achieved   OT LONG TERM GOAL #4   Title Pt will be mod I scar management and desensitization techniques left hand/fingers   Baseline initiated on some digits - not all - scabs still present    Time 4   Period Weeks   Status On-going   OT LONG TERM GOAL #5   Title Pt will demonstrate left hand grip & pinch WFL's, as compared to right dominant hand, for return to daily activities.   Baseline no strenghening yet or resistance - but can this date pick up small , light objects - fold clothing , but not tight or sustained grip    Time 4   Period Weeks   Status On-going               Plan - 07/18/15 1312    Clinical Impression Statement Pt arrive with coban wraps on digist - pain better since doing compression again - but did not had them off since last night and was stiff in ROM in digits- increase HEP to 5 x day - and to do gentle stretch in heat , and add  PROM and place adn hold to 2 cm foam block by wife - she practice in clinic and was doing good - and pt showed increae motions at all joints and diigts to end of session     Rehab Potential Good   OT Frequency 2x /  week   OT Duration 4 weeks   OT Treatment/Interventions Self-care/ADL training;Therapeutic exercise;Patient/family education;Splinting;Manual Therapy;Ultrasound;Therapeutic exercises;Therapeutic activities;DME and/or AE instruction;Parrafin;Fluidtherapy;Scar mobilization;Passive range of  motion;Moist Heat;Cryotherapy   Plan assess ROM progress and how doing with update HEP    OT Home Exercise Plan See pt instructions   Consulted and Agree with Plan of Care Patient   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, sports      Patient will benefit from skilled therapeutic intervention in order to improve the following deficits and impairments:  Decreased coordination, Decreased range of motion, Impaired flexibility, Impaired sensation, Increased edema, Decreased activity tolerance, Decreased skin integrity, Pain, Impaired UE functional use, Decreased scar mobility, Decreased mobility, Decreased strength  Visit Diagnosis: Open displaced fracture of middle phalanx of left index finger, with routine healing, subsequent encounter  Open wound of finger with tendon injury, subsequent encounter  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   Rosalyn Gess OTR/L,CLT  07/18/2015, 1:18 PM  [place] [facility] [address] [place], [address] Phone: [phone]   Fax:  [phone]  Name: Wenceslao Gromek MRN: [id number] Date of Birth: 08-14-1940

## 2015-07-22 ENCOUNTER — Ambulatory Visit: Payer: Medicare Other | Admitting: Occupational Therapy

## 2015-07-22 DIAGNOSIS — S62621D Displaced fracture of medial phalanx of left index finger, subsequent encounter for fracture with routine healing: Secondary | ICD-10-CM | POA: Diagnosis not present

## 2015-07-22 DIAGNOSIS — M6281 Muscle weakness (generalized): Secondary | ICD-10-CM

## 2015-07-22 DIAGNOSIS — R600 Localized edema: Secondary | ICD-10-CM

## 2015-07-22 DIAGNOSIS — S61209D Unspecified open wound of unspecified finger without damage to nail, subsequent encounter: Secondary | ICD-10-CM

## 2015-07-22 DIAGNOSIS — M25642 Stiffness of left hand, not elsewhere classified: Secondary | ICD-10-CM

## 2015-07-22 DIAGNOSIS — S68625D 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, subsequent encounter: Secondary | ICD-10-CM

## 2015-07-22 DIAGNOSIS — R278 Other lack of coordination: Secondary | ICD-10-CM

## 2015-07-22 NOTE — Patient Instructions (Signed)
HEP same - but focus on 2nd digit flexion - keep thumb out of way  Then PROM to 2cm foam block  And then to palm   Coban at night time and as needed during day  Scar massage and wound care as needed and to tolerance

## 2015-07-22 NOTE — Therapy (Signed)
[facility] [address] [place], [address] Phone: [phone]   Fax:  [phone]  Occupational Therapy Treatment  Patient Details  Name: Jose Fowler MRN: [id number] Date of Birth: July 26, 1940 Referring Provider: Dr Hassell Halim  Encounter Date: 07/22/2015      OT End of Session - 07/22/15 1858    Visit Number 9   Number of Visits 12   Date for OT Re-Evaluation 07/29/15   Authorization Type [employer] Medicare   OT Start Time 0950   OT Stop Time 1040   OT Time Calculation (min) 50 min   Activity Tolerance Patient tolerated treatment well   Behavior During Therapy [facility] for tasks assessed/performed      No past medical history on file.  Past Surgical History  Procedure Laterality Date  . Hernia repair    . Prostatectomy    . Colonoscopy with propofol N/A 09/09/2014    Procedure: COLONOSCOPY WITH PROPOFOL;  Surgeon: Manya Silvas, MD;  Location: [facility] ENDOSCOPY;  Service: Endoscopy;  Laterality: N/A;    There were no vitals filed for this visit.      Subjective Assessment - 07/22/15 1852    Subjective  I did the exercises about 5 x day - the middle finger was hurting arond that middle joint - and open area - kept it open to get scab - and then I wrapped the bottom - and tip swelled up - I know that was wrong - tried to use it also little more in light stuff    Patient Stated Goals Pt states that his goal is to get use of left hand back, motion and functional use.    Currently in Pain? Yes   Pain Score 3    Pain Location Finger (Comment which one)   Pain Orientation Left   Pain Descriptors / Indicators Tightness;Burning   Pain Type Surgical pain   Pain Onset 1 to 4 weeks ago            [facility] OT Assessment - 07/22/15 0001    Left Hand AROM   L Index  MCP 0-90 75 Degrees   L Index PIP 0-100 65 Degrees   L Long  MCP 0-90 80 Degrees   L Long PIP 0-100 65 Degrees   L Ring  MCP 0-90 85 Degrees    L Ring PIP 0-100 80 Degrees   L Little PIP 0-100 85 Degrees      Pt arrive with bandages off digits Measurements taken after done some AROM to 4 cm foam block and 2 cm foam block Contrast done with digits flexion stretch increase 2 x to 3 cm and then 2 cm foam block during heat AROM to 4cm foam block and intrinsic fist blocked - 20 reps each    PROM for each joint and digits to 2 cm foam block  PROM to  palmwith all digits  Place and hold to palm for all digits  Opposition To digits - need to focus alternate digits and oval - digits need to meet thumb   At [facility] scab still on DIP of 2nd  And scar adhere at dorsal 3rd - but very thin skin- cannot do to much scar massage - scab on distal pen size open area of last week                       OT Education - 07/22/15 1858    Education  provided Yes   Education Details HEP update   Person(s) Educated Patient;Spouse   Methods Explanation;Demonstration;Tactile cues;Verbal cues;Handout   Comprehension Verbal cues required;Returned demonstration;Verbalized understanding          OT Short Term Goals - 07/18/15 1314    OT SHORT TERM GOAL #1   Title Pt will be I protective splinting use, care and precautions left UE   Status Achieved   OT SHORT TERM GOAL #2   Title Pt will be Mod I HEP as per tendon, fracture and wound healing left hand   Status Achieved   OT SHORT TERM GOAL #3   Title Pt will be Mod I stating edema control techniques L hand   Baseline coban still - cannot use digisleeves - still scabs and one open area on 3rd DIP    Time 3   Period Weeks   Status On-going           OT Long Term Goals - 07/18/15 1314    OT LONG TERM GOAL #1   Title Pt will be Mod I upgraded HEP L hand   Status Achieved   OT LONG TERM GOAL #2   Title Pt will demonstrate AROM for digital flexion, extension and composite flexion/extension WFL's (w/o extensor lag noted left hand).   Baseline progressing - see flowsheet     Time 3   Period Weeks   Status On-going   OT LONG TERM GOAL #3   Title Pt will be Mod I left non-dominant hand use for ADL' and IADL's   Status Achieved   OT LONG TERM GOAL #4   Title Pt will be mod I scar management and desensitization techniques left hand/fingers   Baseline initiated on some digits - not all - scabs still present    Time 4   Period Weeks   Status On-going   OT LONG TERM GOAL #5   Title Pt will demonstrate left hand grip & pinch WFL's, as compared to right dominant hand, for return to daily activities.   Baseline no strenghening yet or resistance - but can this date pick up small , light objects - fold clothing , but not tight or sustained grip    Time 4   Period Weeks   Status On-going               Plan - 07/22/15 1859    Clinical Impression Statement Pt arrive some what discourage but after taking measurements and pt seeing progress as well as progress during session - pt very motivated again - pt to work towards palm this week for ROM - and cont with light functional task    Rehab Potential Good   OT Frequency 2x / week   OT Duration 4 weeks   OT Treatment/Interventions Self-care/ADL training;Therapeutic exercise;Patient/family education;Splinting;Manual Therapy;Ultrasound;Therapeutic exercises;Therapeutic activities;DME and/or AE instruction;Parrafin;Fluidtherapy;Scar mobilization;Passive range of motion;Moist Heat;Cryotherapy   Plan assess ROM - update and modify HEP    OT Home Exercise Plan See pt instructions   Consulted and Agree with Plan of Care Patient      Patient will benefit from skilled therapeutic intervention in order to improve the following deficits and impairments:  Decreased coordination, Decreased range of motion, Impaired flexibility, Impaired sensation, Increased edema, Decreased activity tolerance, Decreased skin integrity, Pain, Impaired UE functional use, Decreased scar mobility, Decreased mobility, Decreased strength  Visit  Diagnosis: Open displaced fracture of middle phalanx of left index finger, with routine healing, subsequent encounter  Open wound of finger with  tendon injury, subsequent encounter  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   Rosalyn Gess OTR/L,CLT 07/22/2015, 7:02 PM  [facility] [address] [place], [address] Phone: [phone]   Fax:  [phone]  Name: Kortland Leonardo MRN: [id number] Date of Birth: May 05, 1940

## 2015-07-28 ENCOUNTER — Ambulatory Visit: Payer: Medicare Other | Attending: Physician Assistant | Admitting: Occupational Therapy

## 2015-07-28 DIAGNOSIS — X58XXXD Exposure to other specified factors, subsequent encounter: Secondary | ICD-10-CM | POA: Diagnosis not present

## 2015-07-28 DIAGNOSIS — S61209D Unspecified open wound of unspecified finger without damage to nail, subsequent encounter: Secondary | ICD-10-CM | POA: Diagnosis present

## 2015-07-28 DIAGNOSIS — S62621D Displaced fracture of medial phalanx of left index finger, subsequent encounter for fracture with routine healing: Secondary | ICD-10-CM | POA: Insufficient documentation

## 2015-07-28 DIAGNOSIS — S68625D 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, subsequent encounter: Secondary | ICD-10-CM | POA: Insufficient documentation

## 2015-07-28 DIAGNOSIS — M6281 Muscle weakness (generalized): Secondary | ICD-10-CM | POA: Diagnosis present

## 2015-07-28 DIAGNOSIS — R600 Localized edema: Secondary | ICD-10-CM

## 2015-07-28 DIAGNOSIS — M25642 Stiffness of left hand, not elsewhere classified: Secondary | ICD-10-CM | POA: Insufficient documentation

## 2015-07-28 DIAGNOSIS — R278 Other lack of coordination: Secondary | ICD-10-CM | POA: Insufficient documentation

## 2015-07-28 DIAGNOSIS — R609 Edema, unspecified: Secondary | ICD-10-CM | POA: Insufficient documentation

## 2015-07-28 NOTE — Patient Instructions (Signed)
Pt to  Cont with  HEP  5 x day  Cont to coban as needed during day -and at night time  composite stretch with heating pad for flexion 3 min 3 x of the sessions instead of contrast  PROM for DIP 's and some biofeedback for DIP 2nd and 3rd  Intrinsic fist stretch DIP/PIP flexion 2nd and 3rd   PROM to 1 cm foam block  And then to palm hold 15 -30 sec x 4  And then place and hold to 2 cm foam block and then palm  8 reps  Use in functional tasks under 1 lbs and no sustained grip -can sweep , fold laundry , put with golf club light and no sustained grip - stretch between swings

## 2015-07-28 NOTE — Therapy (Signed)
[facility] [address] [place], [address] Phone: [phone]   Fax:  [phone]  Occupational Therapy Treatment  Patient Details  Name: Jose Fowler MRN: [id number] Date of Birth: 03/22/1940 Referring Provider: Dr Hassell Halim  Encounter Date: 07/28/2015      OT End of Session - 07/28/15 1821    Visit Number 10   Number of Visits 12   Date for OT Re-Evaluation 07/29/15   Authorization Type [employer]HC Medicare   OT Start Time 1346   OT Stop Time 1444   OT Time Calculation (min) 58 min   Activity Tolerance Patient tolerated treatment well   Behavior During Therapy [facility] for tasks assessed/performed      No past medical history on file.  Past Surgical History  Procedure Laterality Date  . Hernia repair    . Prostatectomy    . Colonoscopy with propofol N/A 09/09/2014    Procedure: COLONOSCOPY WITH PROPOFOL;  Surgeon: Manya Silvas, MD;  Location: [facility] ENDOSCOPY;  Service: Endoscopy;  Laterality: N/A;    There were no vitals filed for this visit.      Subjective Assessment - 07/28/15 1816    Subjective  I can get my fingers together easier , doing some laundry , doing HEP - feel more positive - can see progress in ROM and scars    Patient is accompained by: Family member   Patient Stated Goals Pt states that his goal is to get use of left hand back, motion and functional use.    Currently in Pain? Yes   Pain Score 1    Pain Location Hand   Pain Orientation Left   Pain Descriptors / Indicators Tightness;Aching   Pain Type Surgical pain            [facility] OT Assessment - 07/28/15 0001    Left Hand AROM   L Index  MCP 0-90 85 Degrees   L Index PIP 0-100 80 Degrees   L Long  MCP 0-90 75 Degrees   L Long PIP 0-100 80 Degrees   L Ring  MCP 0-90 85 Degrees   L Ring PIP 0-100 90 Degrees   L Little  MCP 0-90 85 Degrees   L Little PIP 0-100 90 Degrees         Pt arrive with bandages off  digits Measurements taken after done some AROM to 2 cm foam block and palm Flexion stretch to digits to palm with heating pad 8 min  PROM for DIP/PIP flexion  PROM and AROM - biofeedback to DIP flexion 2nd and 3rd  AROM to 2cm foam block and intrinsic fist blocked - 20 reps each   PROM for each joint and digits to palm  Place and hold to palm for all digits     At [facility] scab still on DIP of 2nd - but getting looser  And scar adhere at dorsal 3rd - some scar mobs to done to middle scar - adhere -  - scab on distal 3rd - pin size    Pt to use hand more in light activities that ask full flexion of al digits - like sweeping , putting with golf club - but no sustained or tight grip                OT Education - 07/28/15 1821    Education provided Yes   Education Details HEP update   Person(s) Educated Patient;Spouse   Methods  Explanation;Demonstration;Tactile cues;Verbal cues;Handout   Comprehension Verbal cues required;Returned demonstration;Verbalized understanding          OT Short Term Goals - 07/18/15 1314    OT SHORT TERM GOAL #1   Title Pt will be I protective splinting use, care and precautions left UE   Status Achieved   OT SHORT TERM GOAL #2   Title Pt will be Mod I HEP as per tendon, fracture and wound healing left hand   Status Achieved   OT SHORT TERM GOAL #3   Title Pt will be Mod I stating edema control techniques L hand   Baseline coban still - cannot use digisleeves - still scabs and one open area on 3rd DIP    Time 3   Period Weeks   Status On-going           OT Long Term Goals - 07/18/15 1314    OT LONG TERM GOAL #1   Title Pt will be Mod I upgraded HEP L hand   Status Achieved   OT LONG TERM GOAL #2   Title Pt will demonstrate AROM for digital flexion, extension and composite flexion/extension WFL's (w/o extensor lag noted left hand).   Baseline progressing - see flowsheet    Time 3   Period Weeks   Status On-going   OT LONG  TERM GOAL #3   Title Pt will be Mod I left non-dominant hand use for ADL' and IADL's   Status Achieved   OT LONG TERM GOAL #4   Title Pt will be mod I scar management and desensitization techniques left hand/fingers   Baseline initiated on some digits - not all - scabs still present    Time 4   Period Weeks   Status On-going   OT LONG TERM GOAL #5   Title Pt will demonstrate left hand grip & pinch WFL's, as compared to right dominant hand, for return to daily activities.   Baseline no strenghening yet or resistance - but can this date pick up small , light objects - fold clothing , but not tight or sustained grip    Time 4   Period Weeks   Status On-going               Plan - 07/28/15 1822    Clinical Impression Statement Pt cont to make progress every week in AROM in all digits , and scars improving - scabs coming off - pt to do some scar mobs on 3rd dorsal mid scar - and work on fisting to touch palm now    Rehab Potential Good   OT Frequency 2x / week   OT Duration 4 weeks   OT Treatment/Interventions Self-care/ADL training;Therapeutic exercise;Patient/family education;Splinting;Manual Therapy;Ultrasound;Therapeutic exercises;Therapeutic activities;DME and/or AE instruction;Parrafin;Fluidtherapy;Scar mobilization;Passive range of motion;Moist Heat;Cryotherapy   Plan cont to monitor healing and progress in ROM - update HEP    OT Home Exercise Plan See pt instructions   Consulted and Agree with Plan of Care Patient   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, sports      Patient will benefit from skilled therapeutic intervention in order to improve the following deficits and impairments:  Decreased coordination, Decreased range of motion, Impaired flexibility, Impaired sensation, Increased edema, Decreased activity tolerance, Decreased skin integrity, Pain, Impaired UE functional use, Decreased scar mobility, Decreased mobility, Decreased strength  Visit Diagnosis: Open displaced  fracture of middle phalanx of left index finger, with routine healing, subsequent encounter  Edema of hand  Muscle weakness (generalized)  Stiffness of left hand, not elsewhere classified  Other lack of coordination    Problem List There are no active problems to display for this patient.   Rosalyn Gess OTR/L,CLT  07/28/2015, 6:47 PM  [facility] [address] [place], [address] Phone: [phone]   Fax:  [phone]  Name: Jose Fowler MRN: [id number] Date of Birth: 07-Mar-1940

## 2015-08-04 ENCOUNTER — Ambulatory Visit: Payer: Medicare Other | Admitting: Occupational Therapy

## 2015-08-04 DIAGNOSIS — M25642 Stiffness of left hand, not elsewhere classified: Secondary | ICD-10-CM

## 2015-08-04 DIAGNOSIS — R600 Localized edema: Secondary | ICD-10-CM

## 2015-08-04 DIAGNOSIS — S62621D Displaced fracture of medial phalanx of left index finger, subsequent encounter for fracture with routine healing: Secondary | ICD-10-CM | POA: Diagnosis not present

## 2015-08-04 DIAGNOSIS — R278 Other lack of coordination: Secondary | ICD-10-CM

## 2015-08-04 DIAGNOSIS — M6281 Muscle weakness (generalized): Secondary | ICD-10-CM

## 2015-08-04 NOTE — Therapy (Signed)
[facility] [address] [place], [address] Phone: [phone]   Fax:  [phone]  Occupational Therapy Treatment  Patient Details  Name: Jose Fowler MRN: [id number] Date of Birth: Jun 07, 1940 Referring Provider: Dr Hassell Halim  Encounter Date: 08/04/2015      OT End of Session - 08/04/15 1822    Visit Number 11   Number of Visits 23   Date for OT Re-Evaluation 09/15/15   Authorization Type UHC Medicare   OT Start Time 1446   OT Stop Time 1540   OT Time Calculation (min) 54 min   Activity Tolerance Patient tolerated treatment well   Behavior During Therapy [facility] for tasks assessed/performed      No past medical history on file.  Past Surgical History  Procedure Laterality Date  . Hernia repair    . Prostatectomy    . Colonoscopy with propofol N/A 09/09/2014    Procedure: COLONOSCOPY WITH PROPOFOL;  Surgeon: Manya Silvas, MD;  Location: [facility] ENDOSCOPY;  Service: Endoscopy;  Laterality: N/A;    There were no vitals filed for this visit.      Subjective Assessment - 08/04/15 1453    Subjective  Look I can nearly touch my palm - doing laundry , cooking , - I feeling much better - and seeing the MD tomorrow - still wrapping fingers at night time and as needed during day    Patient Stated Goals Pt states that his goal is to get use of left hand back, motion and functional use.    Currently in Pain? No/denies            [facility] OT Assessment - 08/04/15 0001    Left Hand AROM   L Index  MCP 0-90 85 Degrees   L Index PIP 0-100 85 Degrees   L Long  MCP 0-90 80 Degrees   L Long PIP 0-100 90 Degrees   L Ring  MCP 0-90 90 Degrees   L Ring PIP 0-100 95 Degrees   L Little  MCP 0-90 90 Degrees   L Little PIP 0-100 90 Degrees      Measurements taken Flexion stretch to digits to palm with heating pad 3 min - during contrast - but 2nd and 3rd rotation did intrinsic stretch  in heat - ice  inbetween  To do at home too  PROM for DIP/PIP flexion of 2nd thru 4th  PROM and AROM - biofeedback to DIP flexion 2nd and 3rd  AAROM and PROM to palm  DIP /PIP  Flexion stretch 2 x 30 sec to 2nd and 3rd  Composite flexion and place and hold done to palm  10 reps      Reinforce importance for scar massage to 3rd dorsal scar - fitted with silicon compression sleeve for 4th  - few times during day for about hour - assess skin several times  3rd and 2nd not ready yet  -still some scabs    Pt to use hand more in light activities that ask full flexion of al digits - like sweeping ,laundry  - but no sustained or tight grip                        OT Education - 08/04/15 1821    Education provided Yes   Education Details HEP update   Person(s) Educated Patient   Methods Explanation;Demonstration;Tactile cues;Verbal cues   Comprehension Verbal cues required;Returned demonstration;Verbalized understanding  OT Short Term Goals - 08/04/15 1825    OT SHORT TERM GOAL #1   Title Pt will be I protective splinting use, care and precautions left UE   Status Achieved   OT SHORT TERM GOAL #2   Title Pt will be Mod I HEP as per tendon, fracture and wound healing left hand   Status Achieved   OT SHORT TERM GOAL #3   Title Pt will be Mod I stating edema control techniques L hand   Status Achieved           OT Long Term Goals - 08/04/15 1826    OT LONG TERM GOAL #1   Title Pt will be Mod I upgraded HEP L hand   Status Achieved   OT LONG TERM GOAL #2   Title Pt will demonstrate AROM for digital flexion, extension and composite flexion/extension WFL's (w/o extensor lag noted left hand).   Baseline progressing - see flowsheet   Time 3   Period Weeks   Status On-going   OT LONG TERM GOAL #3   Title Pt will be Mod I left non-dominant hand use for ADL' and IADL's   Status Achieved   OT LONG TERM GOAL #4   Title Pt will be mod I scar management and  desensitization techniques left hand/fingers   Baseline progressing    Time 3   Period Weeks   Status On-going   OT LONG TERM GOAL #5   Title Pt will demonstrate left hand grip & pinch WFL's, as compared to right dominant hand, for return to daily activities.   Baseline await order from MD tomorrow to start strengtheing    Time 4   Period Weeks   Status On-going               Plan - 08/04/15 1823    Clinical Impression Statement Pt made again great progress this last week - able to nearly touch palm coming in - able to touch palm during session - but  still impaired in DIP flexion of3rd more than 2nd - pt to cont with scar massage and work on DIP/PIP flexion - pt to see surgeon tomorrow and to bring order if can start strenghtneing    Rehab Potential Good   OT Frequency 2x / week   OT Duration 6 weeks   OT Treatment/Interventions Self-care/ADL training;Therapeutic exercise;Patient/family education;Splinting;Manual Therapy;Ultrasound;Therapeutic exercises;Therapeutic activities;DME and/or AE instruction;Parrafin;Fluidtherapy;Scar mobilization;Passive range of motion;Moist Heat;Cryotherapy   Plan ? if new orders    OT Home Exercise Plan See pt instructions   Consulted and Agree with Plan of Care Patient   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, sports      Patient will benefit from skilled therapeutic intervention in order to improve the following deficits and impairments:  Decreased coordination, Decreased range of motion, Impaired flexibility, Impaired sensation, Increased edema, Decreased activity tolerance, Decreased skin integrity, Pain, Impaired UE functional use, Decreased scar mobility, Decreased mobility, Decreased strength  Visit Diagnosis: Open displaced fracture of middle phalanx of left index finger, with routine healing, subsequent encounter  Edema of hand  Muscle weakness (generalized)  Stiffness of left hand, not elsewhere classified  Other lack of  coordination    Problem List There are no active problems to display for this patient.   Rosalyn Gess OTR/L,CLT  08/04/2015, 6:30 PM  [facility] [address], [place], [address] Phone: [phone]   Fax:  [phone]  Name: Broadus John  [place] MRN: [id number] Date of Birth: [birth year]

## 2015-08-04 NOTE — Patient Instructions (Signed)
Pt to do during contrast at home  Flexion stretch to digits to palm with heating pad 3 min - during contrast - but 2nd and 3rd rotation do intrinsic stretch  in heat - ice inbetween   PROM for DIP/PIP flexion of 2nd thru 3rd  PROM and AROM - biofeedback to DIP flexion 2nd and 3rd DIP /PIP  Flexion stretch 2 x 30 sec to 2nd and 3rd  Composite flexion and place and hold done to palm  10 reps      Reinforce importance for scar massage to 3rd dorsal scar - fitted with silicon compression sleeve for 4th  - few times during day for about hour - assess skin several times  3rd and 2nd not ready yet  -still some scabs    Pt to use hand more in light activities that ask full flexion of al digits - like sweeping ,laundry  - but no sustained or tight grip

## 2015-08-11 ENCOUNTER — Ambulatory Visit: Payer: Medicare Other | Admitting: Occupational Therapy

## 2015-08-16 ENCOUNTER — Ambulatory Visit: Payer: Medicare Other | Admitting: Occupational Therapy

## 2015-08-16 DIAGNOSIS — S62621D Displaced fracture of medial phalanx of left index finger, subsequent encounter for fracture with routine healing: Secondary | ICD-10-CM

## 2015-08-16 DIAGNOSIS — M25642 Stiffness of left hand, not elsewhere classified: Secondary | ICD-10-CM

## 2015-08-16 DIAGNOSIS — S68625D 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, subsequent encounter: Secondary | ICD-10-CM

## 2015-08-16 DIAGNOSIS — S61209D Unspecified open wound of unspecified finger without damage to nail, subsequent encounter: Secondary | ICD-10-CM

## 2015-08-16 DIAGNOSIS — R278 Other lack of coordination: Secondary | ICD-10-CM

## 2015-08-16 DIAGNOSIS — R600 Localized edema: Secondary | ICD-10-CM

## 2015-08-16 DIAGNOSIS — M6281 Muscle weakness (generalized): Secondary | ICD-10-CM

## 2015-08-16 NOTE — Therapy (Signed)
[place] [facility] [address], [place], [address] Phone: [phone]   Fax:  [phone]  Occupational Therapy Treatment  Patient Details  Name: Jose Fowler MRN: [id number] Date of Birth: 17-Mar-1940 Referring Provider: Dr Hassell Halim  Encounter Date: 08/16/2015      OT End of Session - 08/16/15 1754    Visit Number 12   Number of Visits 23   Date for OT Re-Evaluation 09/15/15   Authorization Type [employer] Medicare   OT Start Time 1120   OT Stop Time 1205   OT Time Calculation (min) 45 min   Activity Tolerance Patient tolerated treatment well   Behavior During Therapy [facility] for tasks assessed/performed      No past medical history on file.  Past Surgical History:  Procedure Laterality Date  . COLONOSCOPY WITH PROPOFOL N/A 09/09/2014   Procedure: COLONOSCOPY WITH PROPOFOL;  Surgeon: Manya Silvas, MD;  Location: [facility] ENDOSCOPY;  Service: Endoscopy;  Laterality: N/A;  . HERNIA REPAIR    . PROSTATECTOMY      There were no vitals filed for this visit.      Subjective Assessment - 08/16/15 1127    Subjective   Stiff in the am - using it more - driving , 1/2 empty milk bottle, dishwasher, bathing and dressing and squeeze washcloth    Patient Stated Goals Pt states that his goal is to get use of left hand back, motion and functional use.    Currently in Pain? No/denies            [facility] OT Assessment - 08/16/15 0001      AROM   Right Wrist Extension 57 Degrees   Right Wrist Flexion 76 Degrees   Left Wrist Extension 44 Degrees   Left Wrist Flexion 56 Degrees     Strength   Right Hand Grip (lbs) 55   Right Hand Lateral Pinch 21 lbs   Right Hand 3 Point Pinch 16 lbs   Left Hand Grip (lbs) 16   Left Hand Lateral Pinch 18 lbs   Left Hand 3 Point Pinch 8 lbs     Left Hand AROM   L Index  MCP 0-90 85 Degrees   L Index PIP 0-100 85 Degrees   L Index DIP 0-70 --  -30 rest - active flexion 52   L  Long  MCP 0-90 88 Degrees   L Long PIP 0-100 88 Degrees   L Ring  MCP 0-90 95 Degrees   L Ring PIP 0-100 95 Degrees   L Little  MCP 0-90 90 Degrees   L Little PIP 0-100 98 Degrees            measurements taken for digits , wrist and grip /prehension - See flow sheet      OT Treatments/Exercises (OP) - 08/16/15 0001      LUE Fluidotherapy   Number Minutes Fluidotherapy 10 Minutes   LUE Fluidotherapy Location Hand;Wrist   Comments AROM for wrist and digits in all planes prior to review of ther ex  - to increaes ROM         PROM for flexion of digits   and AROM - focus on PIP flexion 2nd thru 5th , MC 2nd   Upgrade from light blue  to teal putty - grip into round putty  and end range cylinder afterwards Lat grip  3 point grip  Roll and pinch alternate digits 10 reps  2 x day  Wrist AAROM and PROM for ext, flexion  1 lbs weight for all planes for wrist   can use 1lbs to stretch into flexion of wrist and then into extention  12 reps   3 x day  Cont scar mobs and digisleeves for night time   Scar mobs done - and digisleeves for night time 'ad decrease compression to 25% during day            OT Education - 08/16/15 1754    Education provided Yes   Education Details HEP changes   Person(s) Educated Patient   Methods Explanation;Demonstration;Tactile cues;Verbal cues;Handout   Comprehension Verbal cues required;Returned demonstration;Verbalized understanding          OT Short Term Goals - 08/04/15 1825      OT SHORT TERM GOAL #1   Title Pt will be I protective splinting use, care and precautions left UE   Status Achieved     OT SHORT TERM GOAL #2   Title Pt will be Mod I HEP as per tendon, fracture and wound healing left hand   Status Achieved     OT SHORT TERM GOAL #3   Title Pt will be Mod I stating edema control techniques L hand   Status Achieved           OT Long Term Goals - 08/04/15 1826      OT LONG TERM GOAL #1   Title Pt will  be Mod I upgraded HEP L hand   Status Achieved     OT LONG TERM GOAL #2   Title Pt will demonstrate AROM for digital flexion, extension and composite flexion/extension WFL's (w/o extensor lag noted left hand).   Baseline progressing - see flowsheet   Time 3   Period Weeks   Status On-going     OT LONG TERM GOAL #3   Title Pt will be Mod I left non-dominant hand use for ADL' and IADL's   Status Achieved     OT LONG TERM GOAL #4   Title Pt will be mod I scar management and desensitization techniques left hand/fingers   Baseline progressing    Time 3   Period Weeks   Status On-going     OT LONG TERM GOAL #5   Title Pt will demonstrate left hand grip & pinch WFL's, as compared to right dominant hand, for return to daily activities.   Baseline await order from MD tomorrow to start strengtheing    Time 4   Period Weeks   Status On-going               Plan - 08/16/15 1755    Clinical Impression Statement Pt made great progress in digits flexion , grip and prehension assessed - upgrade putty - and pt to cont with ROM and scar mobs - as well asdecrease daytime compression to 25% , and then add 1 lbs wight to wrist in all planes - after doing PROM for flexion and extnetion of wrist -    Rehab Potential Good   OT Frequency 2x / week   OT Duration 6 weeks   OT Treatment/Interventions Self-care/ADL training;Therapeutic exercise;Patient/family education;Splinting;Manual Therapy;Ultrasound;Therapeutic exercises;Therapeutic activities;DME and/or AE instruction;Parrafin;Fluidtherapy;Scar mobilization;Passive range of motion;Moist Heat;Cryotherapy   Plan pt had new orders from MD to start strenghening  - cont to upgrade resistance gradually    OT Home Exercise Plan See pt instructions   Consulted and Agree with Plan of Care Patient   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      Patient will benefit from skilled therapeutic intervention in order to improve the following deficits  and impairments:  Decreased coordination, Decreased range of motion, Impaired flexibility, Impaired sensation, Increased edema, Decreased activity tolerance, Decreased skin integrity, Pain, Impaired UE functional use, Decreased scar mobility, Decreased mobility, Decreased strength  Visit Diagnosis: Open displaced fracture of middle phalanx of left index finger, with routine healing, subsequent encounter  Edema of hand  Muscle weakness (generalized)  Stiffness of left hand, not elsewhere classified  Other lack of coordination  Open wound of finger with tendon injury, subsequent encounter  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   Rosalyn Gess OTR/L,CLT  08/16/2015, 5:58 PM  [place] [facility] [address] [place], [address] Phone: [phone]   Fax:  [phone]  Name: Jose Fowler MRN: [id number] Date of Birth: 1940/02/13

## 2015-08-16 NOTE — Patient Instructions (Signed)
Heat  PROM for flexion of digits   and AROM - focus on PIP flexion 2nd thru 5th , MC 2nd   Upgrade to teal putty - ball and end range cylinder  Lat grip  3 point grip  Roll and pinch alternate digits 10 reps  2 x day   Wrist AAROM and PROM for ext, flexion  1 lbs weight for all planes for wrist  12 reps   3 x day  Cont scar mobs and digisleeves for night time

## 2015-08-18 ENCOUNTER — Encounter: Payer: Medicare Other | Admitting: Occupational Therapy

## 2015-08-23 ENCOUNTER — Ambulatory Visit: Payer: Medicare Other | Attending: Physician Assistant | Admitting: Occupational Therapy

## 2015-08-23 DIAGNOSIS — S62621D Displaced fracture of medial phalanx of left index finger, subsequent encounter for fracture with routine healing: Secondary | ICD-10-CM

## 2015-08-23 DIAGNOSIS — M25642 Stiffness of left hand, not elsewhere classified: Secondary | ICD-10-CM | POA: Diagnosis not present

## 2015-08-23 DIAGNOSIS — S68625D 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, subsequent encounter: Secondary | ICD-10-CM | POA: Diagnosis not present

## 2015-08-23 DIAGNOSIS — W312XXD Contact with powered woodworking and forming machines, subsequent encounter: Secondary | ICD-10-CM | POA: Diagnosis not present

## 2015-08-23 DIAGNOSIS — S61209D Unspecified open wound of unspecified finger without damage to nail, subsequent encounter: Secondary | ICD-10-CM | POA: Diagnosis not present

## 2015-08-23 DIAGNOSIS — R278 Other lack of coordination: Secondary | ICD-10-CM | POA: Diagnosis not present

## 2015-08-23 DIAGNOSIS — R609 Edema, unspecified: Secondary | ICD-10-CM | POA: Diagnosis not present

## 2015-08-23 DIAGNOSIS — M6281 Muscle weakness (generalized): Secondary | ICD-10-CM

## 2015-08-23 DIAGNOSIS — R600 Localized edema: Secondary | ICD-10-CM

## 2015-08-23 NOTE — Therapy (Signed)
[place] [facility] [address] [place], [address] Phone: [phone]   Fax:  [phone]  Occupational Therapy Treatment  Patient Details  Name: Jose Fowler MRN: [id number] Date of Birth: 02/05/1940 Referring Provider: Dr Hassell Halim  Encounter Date: 08/23/2015      OT End of Session - 08/23/15 1815    Visit Number 13   Number of Visits 23   Date for OT Re-Evaluation 09/15/15   Authorization Type [employer] Medicare   OT Start Time 1403   OT Stop Time 1447   OT Time Calculation (min) 44 min   Activity Tolerance Patient tolerated treatment well   Behavior During Therapy [facility] for tasks assessed/performed      No past medical history on file.  Past Surgical History:  Procedure Laterality Date  . COLONOSCOPY WITH PROPOFOL N/A 09/09/2014   Procedure: COLONOSCOPY WITH PROPOFOL;  Surgeon: Manya Silvas, MD;  Location: [facility] ENDOSCOPY;  Service: Endoscopy;  Laterality: N/A;  . HERNIA REPAIR    . PROSTATECTOMY      There were no vitals filed for this visit.      Subjective Assessment - 08/23/15 1806    Subjective  Doing okay - putty was hard on the the index - and my fingers still stiff - started working my fingers about 20 min prior to coming in    Patient Stated Goals Pt states that his goal is to get use of left hand back, motion and functional use.    Currently in Pain? No/denies            [facility] OT Assessment - 08/23/15 0001      Left Hand AROM   L Index  MCP 0-90 90 Degrees   L Index PIP 0-100 90 Degrees   L Long  MCP 0-90 90 Degrees   L Long PIP 0-100 90 Degrees     Measurements taken for ROM - improve   but grip and prehension same   Fluido for AROM for digits and wrist at [facility] to increase ROM and decrease stiffness    Scar massage - done to 3rd -    teal putty -  grip , lat grip  Had pain with 3 point into putty - pt to do  Roll putty and alternate 2nd and 3rd pinch ,and 4th and 5th  pinch   pulling and twisting - using all digits or 2nd thru 4th  10-15 reps - no pain    if good increase to 2nd set   2 lbs for sup/pro, RD and UD , flexion  - 12 reps Use 1 lbs for extnetion - after table stretch or slide for wrist extention  - done and add to HEP   Provided green putty to add to teal in week for more resistance                      OT Education - 08/23/15 1815    Education provided Yes   Education Details update HEP    Person(s) Educated Patient   Methods Demonstration;Tactile cues;Verbal cues;Explanation;Handout   Comprehension Verbal cues required;Returned demonstration;Verbalized understanding          OT Short Term Goals - 08/04/15 1825      OT SHORT TERM GOAL #1   Title Pt will be I protective splinting use, care and precautions left UE   Status Achieved     OT SHORT TERM GOAL #2  Title Pt will be Mod I HEP as per tendon, fracture and wound healing left hand   Status Achieved     OT SHORT TERM GOAL #3   Title Pt will be Mod I stating edema control techniques L hand   Status Achieved           OT Long Term Goals - 08/04/15 1826      OT LONG TERM GOAL #1   Title Pt will be Mod I upgraded HEP L hand   Status Achieved     OT LONG TERM GOAL #2   Title Pt will demonstrate AROM for digital flexion, extension and composite flexion/extension WFL's (w/o extensor lag noted left hand).   Baseline progressing - see flowsheet   Time 3   Period Weeks   Status On-going     OT LONG TERM GOAL #3   Title Pt will be Mod I left non-dominant hand use for ADL' and IADL's   Status Achieved     OT LONG TERM GOAL #4   Title Pt will be mod I scar management and desensitization techniques left hand/fingers   Baseline progressing    Time 3   Period Weeks   Status On-going     OT LONG TERM GOAL #5   Title Pt will demonstrate left hand grip & pinch WFL's, as compared to right dominant hand, for return to daily activities.   Baseline await  order from MD tomorrow to start strengtheing    Time 4   Period Weeks   Status On-going               Plan - 08/23/15 1816    Clinical Impression Statement Pt cont to make progress in ROM in digits - scar healing - grip and prehension about the same than last week - but need to work still on composite fist - still stiffness if not exercising digiits -  wrist extention still limtied - add some new HEP and  up to 2lbs to wrist    Rehab Potential Good   OT Frequency 2x / week   OT Duration 4 weeks   OT Treatment/Interventions Self-care/ADL training;Therapeutic exercise;Patient/family education;Splinting;Manual Therapy;Ultrasound;Therapeutic exercises;Therapeutic activities;DME and/or AE instruction;Parrafin;Fluidtherapy;Scar mobilization;Passive range of motion;Moist Heat;Cryotherapy   Plan assess progress - pt going to be week out of town    [place] See pt instructions   Consulted and Agree with Plan of Care Patient   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, sports      Patient will benefit from skilled therapeutic intervention in order to improve the following deficits and impairments:  Decreased coordination, Decreased range of motion, Impaired flexibility, Impaired sensation, Increased edema, Decreased activity tolerance, Decreased skin integrity, Pain, Impaired UE functional use, Decreased scar mobility, Decreased mobility, Decreased strength  Visit Diagnosis: Open displaced fracture of middle phalanx of left index finger, with routine healing, subsequent encounter  Edema of hand  Muscle weakness (generalized)  Stiffness of left hand, not elsewhere classified  Other lack of coordination  Open wound of finger with tendon injury, subsequent encounter  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   Rosalyn Gess  OTR/L,CLT  08/23/2015, 6:20  PM  [place] [facility] [address] [place], [address] Phone: [phone]   Fax:  [phone]  Name: Jose Fowler MRN: [id number] Date of Birth: 05/28/1940

## 2015-08-23 NOTE — Patient Instructions (Signed)
Cont with PROM and AROM on digits  Scar massage -  And then  Cont teal putty - but grip , lat grip  Roll putty and alternate 2nd and 3rd pinch ,and 4th and 5th pinch  Can do pulling and twisting - using all digits or 2nd thru 4th  10-15 reps - no pain    if good increase to 2nd set   2 lbs for sup/pro, RD and UD , flexion  Use 1 lbs for extnetion - after table stretch or slide for wrist extention   Provided green putty to add to teal in week for more resistance

## 2015-08-25 ENCOUNTER — Encounter: Payer: Medicare Other | Admitting: Occupational Therapy

## 2015-09-06 ENCOUNTER — Ambulatory Visit: Payer: Medicare Other | Admitting: Occupational Therapy

## 2015-09-06 DIAGNOSIS — S68625D 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, subsequent encounter: Secondary | ICD-10-CM

## 2015-09-06 DIAGNOSIS — S62621D Displaced fracture of medial phalanx of left index finger, subsequent encounter for fracture with routine healing: Secondary | ICD-10-CM

## 2015-09-06 DIAGNOSIS — R278 Other lack of coordination: Secondary | ICD-10-CM

## 2015-09-06 DIAGNOSIS — M25642 Stiffness of left hand, not elsewhere classified: Secondary | ICD-10-CM

## 2015-09-06 DIAGNOSIS — M6281 Muscle weakness (generalized): Secondary | ICD-10-CM

## 2015-09-06 NOTE — Therapy (Signed)
[place] [facility] [address], [place], [address] Phone: [phone]   Fax:  [phone]  Occupational Therapy Treatment  Patient Details  Name: Jose Fowler MRN: [id number] Date of Birth: 11-25-1940 Referring Provider: Dr Hassell Halim  Encounter Date: 09/06/2015      OT End of Session - 09/06/15 1455    Visit Number 14   Number of Visits 23   Date for OT Re-Evaluation 09/15/15   Authorization Type [employer] Medicare   OT Start Time 0902   OT Stop Time 0945   OT Time Calculation (min) 43 min   Activity Tolerance Patient tolerated treatment well   Behavior During Therapy [facility] for tasks assessed/performed      No past medical history on file.  Past Surgical History:  Procedure Laterality Date  . COLONOSCOPY WITH PROPOFOL N/A 09/09/2014   Procedure: COLONOSCOPY WITH PROPOFOL;  Surgeon: Manya Silvas, MD;  Location: [facility] ENDOSCOPY;  Service: Endoscopy;  Laterality: N/A;  . HERNIA REPAIR    . PROSTATECTOMY      There were no vitals filed for this visit.      Subjective Assessment - 09/06/15 0908    Subjective  Stiffness in the am - no pain really - did see MD was very happy with hand - go back in 4 months - pick gallon milk,    Patient Stated Goals Pt states that his goal is to get use of left hand back, motion and functional use.    Currently in Pain? No/denies            [facility] OT Assessment - 09/06/15 0001      AROM   Right Wrist Extension 57 Degrees   Right Wrist Flexion 75 Degrees   Left Wrist Extension 56 Degrees   Left Wrist Flexion 80 Degrees     Strength   Right Hand Grip (lbs) 55   Right Hand Lateral Pinch 21 lbs   Right Hand 3 Point Pinch 16 lbs   Left Hand Grip (lbs) 16   Left Hand Lateral Pinch 20 lbs   Left Hand 3 Point Pinch 10 lbs     Left Hand AROM   L Index  MCP 0-90 90 Degrees   L Index PIP 0-100 80 Degrees   L Long  MCP 0-90 90 Degrees   L Long PIP 0-100 88 Degrees    L Ring  MCP 0-90 90 Degrees   L Ring PIP 0-100 92 Degrees   L Little  MCP 0-90 90 Degrees   L Little PIP 0-100 100 Degrees                  OT Treatments/Exercises (OP) - 09/06/15 0001      LUE Fluidotherapy   Number Minutes Fluidotherapy 10 Minutes   LUE Fluidotherapy Location Hand   Comments Increase PIP flexion at [facility]       Measurements taken for ROM - see flowsheet -pt report wrist feels good - no issues anymore     Fluido for AROM for digits  at [facility] to increase ROM and decrease stiffness    Scar massage - done to 3rd -  PROM for PIP and DIP 2nd and 3rd Flexion stretch for DIP/PIP flexion held 2 x 1 min for 2nd and 3rd  Composite flexion PROM  Measured AROM - increase at 2nd and 3rd PIP to 95 degrees  End range grip into 2 cm foam block  grip into putty for full grip and pinch 3 point  HEP updated             OT Education - 09/06/15 1455    Education provided Yes   Education Details Update HEP and discuss progress   Person(s) Educated Patient   Methods Explanation;Demonstration;Tactile cues;Verbal cues;Handout   Comprehension Verbal cues required;Returned demonstration;Verbalized understanding          OT Short Term Goals - 08/04/15 1825      OT SHORT TERM GOAL #1   Title Pt will be I protective splinting use, care and precautions left UE   Status Achieved     OT SHORT TERM GOAL #2   Title Pt will be Mod I HEP as per tendon, fracture and wound healing left hand   Status Achieved     OT SHORT TERM GOAL #3   Title Pt will be Mod I stating edema control techniques L hand   Status Achieved           OT Long Term Goals - 08/04/15 1826      OT LONG TERM GOAL #1   Title Pt will be Mod I upgraded HEP L hand   Status Achieved     OT LONG TERM GOAL #2   Title Pt will demonstrate AROM for digital flexion, extension and composite flexion/extension WFL's (w/o extensor lag noted left hand).   Baseline progressing - see flowsheet    Time 3   Period Weeks   Status On-going     OT LONG TERM GOAL #3   Title Pt will be Mod I left non-dominant hand use for ADL' and IADL's   Status Achieved     OT LONG TERM GOAL #4   Title Pt will be mod I scar management and desensitization techniques left hand/fingers   Baseline progressing    Time 3   Period Weeks   Status On-going     OT LONG TERM GOAL #5   Title Pt will demonstrate left hand grip & pinch WFL's, as compared to right dominant hand, for return to daily activities.   Baseline await order from MD tomorrow to start strengtheing    Time 4   Period Weeks   Status On-going               Plan - 09/06/15 1457    Clinical Impression Statement  Pt made progress in functional use , pain , prehension - but PIP flexion at 2nd and 3rd still impaired and composite fist - in range pt is about 50% but end range more like 30% compare to R - pt to do HEP for 2 wks and return    Rehab Potential Good   OT Frequency Biweekly   OT Duration 2 weeks   OT Treatment/Interventions Self-care/ADL training;Therapeutic exercise;Patient/family education;Splinting;Manual Therapy;Ultrasound;Therapeutic exercises;Therapeutic activities;DME and/or AE instruction;Parrafin;Fluidtherapy;Scar mobilization;Passive range of motion;Moist Heat;Cryotherapy   Plan assess progressin PIP flexion and composite fist /grip    OT Home Exercise Plan See pt instructions   Consulted and Agree with Plan of Care Patient      Patient will benefit from skilled therapeutic intervention in order to improve the following deficits and impairments:  Decreased coordination, Decreased range of motion, Impaired flexibility, Impaired sensation, Increased edema, Decreased activity tolerance, Decreased skin integrity, Pain, Impaired UE functional use, Decreased scar mobility, Decreased mobility, Decreased strength  Visit Diagnosis: Open displaced fracture of middle phalanx of left index finger, with routine healing,  subsequent encounter  Muscle  weakness (generalized)  Stiffness of left hand, not elsewhere classified  Other lack of coordination  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   Rosalyn Gess OTR/L,CLT 09/06/2015, 3:01 PM  [place] [facility] [address] [place], [address] Phone: [phone]   Fax:  [phone]  Name: Jose Fowler MRN: [id number] Date of Birth: 06-26-1940

## 2015-09-06 NOTE — Patient Instructions (Signed)
Heat  Scar massage  PROM for DIP and PIP flexion  Hold intrinsic stretch  AROM blocked Intrinsic  PROM composite fist  End range into green foam 2cm  Putty for grip and  pinching

## 2015-09-21 ENCOUNTER — Ambulatory Visit: Payer: Medicare Other | Admitting: Occupational Therapy

## 2015-09-21 DIAGNOSIS — S68625D 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, subsequent encounter: Secondary | ICD-10-CM

## 2015-09-21 DIAGNOSIS — R278 Other lack of coordination: Secondary | ICD-10-CM

## 2015-09-21 DIAGNOSIS — M25642 Stiffness of left hand, not elsewhere classified: Secondary | ICD-10-CM

## 2015-09-21 DIAGNOSIS — M6281 Muscle weakness (generalized): Secondary | ICD-10-CM

## 2015-09-21 DIAGNOSIS — R600 Localized edema: Secondary | ICD-10-CM

## 2015-09-21 NOTE — Patient Instructions (Signed)
pt to get Mederma for scar use - with SPF in too  And provided new silicon sleeves to use on 3rd night time - and as needed on 2nd and 4th - but try and go without any compression on 2nd and 4th at night time - and assess edema If increase edema - do compression with silicon sleeves - should decrease stiffness in am - pt was wrapping  With coban  Cont with scar  massage   PROM for PIP and DIP 2nd and 3rd Flexion stretch for DIP/PIP flexion held 2 x 1 min for 2nd and 3rd  Composite flexion PROM  Place and hold  Upgrade to dark blue putty for end range grip , full grip, 3 point and lateral grip  10-15 reps

## 2015-09-21 NOTE — Therapy (Signed)
[place] [facility] [address] [place], [address] Phone: [phone]   Fax:  [phone]  Occupational Therapy Treatment  Patient Details  Name: Jose Fowler MRN: [id number] Date of Birth: 24-May-1940 Referring Provider: Dr Hassell Halim  Encounter Date: 09/21/2015      OT End of Session - 09/21/15 1013    Visit Number 15   Number of Visits 16   Date for OT Re-Evaluation 10/26/15   OT Start Time 0903   OT Stop Time 0938   OT Time Calculation (min) 35 min   Activity Tolerance Patient tolerated treatment well   Behavior During Therapy [facility] for tasks assessed/performed      No past medical history on file.  Past Surgical History:  Procedure Laterality Date  . COLONOSCOPY WITH PROPOFOL N/A 09/09/2014   Procedure: COLONOSCOPY WITH PROPOFOL;  Surgeon: Manya Silvas, MD;  Location: [facility] ENDOSCOPY;  Service: Endoscopy;  Laterality: N/A;  . HERNIA REPAIR    . PROSTATECTOMY      There were no vitals filed for this visit.      Subjective Assessment - 09/21/15 0952    Subjective  Still stiff in the am - I am still wrapping my fingers at night time - and about hour 2 x during day -  I can tell using it more and more strength- was in wood shop this week  - did okay   Patient Stated Goals Pt states that his goal is to get use of left hand back, motion and functional use.    Currently in Pain? No/denies            [facility] OT Assessment - 09/21/15 0001      Strength   Right Hand Grip (lbs) 55   Right Hand Lateral Pinch 21 lbs   Right Hand 3 Point Pinch 16 lbs   Left Hand Grip (lbs) 24   Left Hand Lateral Pinch 18 lbs   Left Hand 3 Point Pinch 12 lbs     Left Hand AROM   L Index PIP 0-100 90 Degrees   L Long PIP 0-100 90 Degrees   L Ring PIP 0-100 98 Degrees   L Little PIP 0-100 100 Degrees    Measurements taken for ROM /grip and prehension strength- see flowsheet -pt report wrist feels good - still  no issues    Scars check - still adhere and red on 3rd mostly and distal 2nd - pt to get Mederma for scar use - with SPF in too  And provided new silicon sleeves to use on 3rd night time - and as needed on 2nd and 4th - but try and go without any compression on 2nd and 4th at night time - and assess edema If increase edema - do compression with silicon sleeves - should decrease stiffness in am - pt was wrapping  With coban  Cont with scar  massage  And done in clinic this date on  3rd mostly PROM for PIP and DIP 2nd and 3rd Flexion stretch for DIP/PIP flexion held 2 x 1 min for 2nd and 3rd  Composite flexion PROM  Place and hold  Upgrade to dark blue putty for end range grip , full grip, 3 point and lateral grip  10-15 reps                         OT Education - 09/21/15 1013    Education  provided Yes   Education Details HEP update   Person(s) Educated Patient   Methods Explanation;Demonstration;Tactile cues;Verbal cues;Handout   Comprehension Returned demonstration;Verbalized understanding;Verbal cues required          OT Short Term Goals - 09/21/15 1016      OT SHORT TERM GOAL #1   Title Pt will be I protective splinting use, care and precautions left UE     OT SHORT TERM GOAL #2   Title Pt will be Mod I HEP as per tendon, fracture and wound healing left hand   Status Achieved     OT SHORT TERM GOAL #3   Title Pt will be Mod I stating edema control techniques L hand   Status Achieved           OT Long Term Goals - 09/21/15 1017      OT LONG TERM GOAL #1   Title Pt will be Mod I upgraded HEP L hand   Status Achieved     OT LONG TERM GOAL #2   Title Pt will demonstrate AROM for digital flexion, extension and composite flexion/extension WFL's (w/o extensor lag noted left hand).   Status Achieved     OT LONG TERM GOAL #3   Title Pt will be Mod I left non-dominant hand use for ADL' and IADL's   Status Achieved     OT LONG TERM GOAL #4    Title Pt will be mod I scar management and desensitization techniques left hand/fingers   Status Achieved     OT LONG TERM GOAL #5   Title Pt will demonstrate left hand grip & pinch WFL's, as compared to right dominant hand, for return to daily activities.   Baseline Grip on L 24, R 55   Status On-going               Plan - 09/21/15 1014    Clinical Impression Statement Pt made good progress the last 2 wks in 2nd and 3rd PIP's flexion , and increase grip and 3 point grip this date compare to last time - did not improve - pt to work on end range and grip - increase resistance on putty - and  pt to try and wean out of compression  at night time - if need issued silicon compression sleeves to use in stead of coban  - will follow up in month  to reassess ROM , grip and scar tissue    Rehab Potential Good   OT Frequency Monthly   OT Duration 4 weeks   OT Treatment/Interventions Self-care/ADL training;Therapeutic exercise;Patient/family education;Splinting;Manual Therapy;Ultrasound;Therapeutic exercises;Therapeutic activities;DME and/or AE instruction;Parrafin;Fluidtherapy;Scar mobilization;Passive range of motion;Moist Heat;Cryotherapy   Plan assess progress in end range , grip and scar adhesions   OT Home Exercise Plan See pt instructions   Consulted and Agree with Plan of Care Patient   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, sports      Patient will benefit from skilled therapeutic intervention in order to improve the following deficits and impairments:  Decreased coordination, Decreased range of motion, Impaired flexibility, Impaired sensation, Increased edema, Decreased activity tolerance, Decreased skin integrity, Pain, Impaired UE functional use, Decreased scar mobility, Decreased mobility, Decreased strength  Visit Diagnosis: Muscle weakness (generalized)  Stiffness of left hand, not elsewhere classified  Other lack of coordination  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  Rosalyn Gess OTR/L,CLT  09/21/2015, 10:21 AM  [place] [facility] [address] [place], [address] Phone: [phone]   Fax:  [phone]  Name: Jasaan Thigpen MRN: [id number] Date of Birth: 1940/07/04

## 2015-10-18 ENCOUNTER — Ambulatory Visit: Payer: Medicare Other | Attending: Physician Assistant | Admitting: Occupational Therapy

## 2015-10-18 DIAGNOSIS — S61209D Unspecified open wound of unspecified finger without damage to nail, subsequent encounter: Secondary | ICD-10-CM | POA: Diagnosis present

## 2015-10-18 DIAGNOSIS — M6281 Muscle weakness (generalized): Secondary | ICD-10-CM | POA: Diagnosis present

## 2015-10-18 DIAGNOSIS — S62621D Displaced fracture of medial phalanx of left index finger, subsequent encounter for fracture with routine healing: Secondary | ICD-10-CM | POA: Diagnosis present

## 2015-10-18 DIAGNOSIS — M25642 Stiffness of left hand, not elsewhere classified: Secondary | ICD-10-CM | POA: Diagnosis present

## 2015-10-18 DIAGNOSIS — R609 Edema, unspecified: Secondary | ICD-10-CM | POA: Insufficient documentation

## 2015-10-18 DIAGNOSIS — S68625D 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, subsequent encounter: Secondary | ICD-10-CM | POA: Diagnosis present

## 2015-10-18 DIAGNOSIS — R600 Localized edema: Secondary | ICD-10-CM | POA: Diagnosis present

## 2015-10-18 DIAGNOSIS — R278 Other lack of coordination: Secondary | ICD-10-CM | POA: Diagnosis present

## 2015-10-18 NOTE — Therapy (Signed)
[place] [facility] [address] [place], [address] Phone: [phone]   Fax:  [phone]  Occupational Therapy Treatment and discharge  Patient Details  Name: Jose Fowler MRN: [id number] Date of Birth: 07/12/1940 Referring Provider: Dr Hassell Halim  Encounter Date: 10/18/2015      OT End of Session - 10/18/15 1451    Visit Number 16   Number of Visits 16   Date for OT Re-Evaluation 10/26/15   OT Start Time 1301   OT Stop Time 1343   OT Time Calculation (min) 42 min   Activity Tolerance Patient tolerated treatment well   Behavior During Therapy [facility] for tasks assessed/performed      No past medical history on file.  Past Surgical History:  Procedure Laterality Date  . COLONOSCOPY WITH PROPOFOL N/A 09/09/2014   Procedure: COLONOSCOPY WITH PROPOFOL;  Surgeon: Manya Silvas, MD;  Location: [facility] ENDOSCOPY;  Service: Endoscopy;  Laterality: N/A;  . HERNIA REPAIR    . PROSTATECTOMY      There were no vitals filed for this visit.      Subjective Assessment - 10/18/15 1446    Subjective  I am doing okay - only thing my middle finger clicks in the am when I am trying to get my fist to loosen up - here down in my palm - otherwise doing good - more ROM , strength and using it    Patient Stated Goals Pt states that his goal is to get use of left hand back, motion and functional use.    Currently in Pain? No/denies            [facility] OT Assessment - 10/18/15 0001      AROM   Right Wrist Extension 67 Degrees   Right Wrist Flexion 85 Degrees   Left Wrist Extension 68 Degrees   Left Wrist Flexion 80 Degrees     Strength   Right Hand Grip (lbs) 65   Right Hand Lateral Pinch 19 lbs   Right Hand 3 Point Pinch 13 lbs   Left Hand Grip (lbs) 40   Left Hand Lateral Pinch 17 lbs   Left Hand 3 Point Pinch 12 lbs     Left Hand AROM   L Index  MCP 0-90 90 Degrees   L Index PIP 0-100 92 Degrees   L Long   MCP 0-90 90 Degrees   L Long PIP 0-100 98 Degrees   L Ring  MCP 0-90 90 Degrees   L Ring PIP 0-100 100 Degrees   L Little  MCP 0-90 90 Degrees   L Little PIP 0-100 100 Degrees       Measurements taken for digits and wrist AROM  Grip and prehension  PRWHE done 2 1/2 /50 for pain , function 50/50  See flowsheets Pt do do tendon glides in am , and PROM for 3rd digit flexion - prior to making full fist AROM  Can do ice over A1pulley of 3rd - during day - if tender Had some tenderness but very little  Scar check - pt to cont with scar massage and compression - silicon sleeves provided                      OT Education - 10/18/15 1451    Education provided Yes   Education Details discharge instructions   Person(s) Educated Patient   Methods Explanation;Demonstration;Tactile cues;Verbal cues   Comprehension Returned demonstration;Verbalized  understanding;Verbal cues required          OT Short Term Goals - 10/18/15 1454      OT SHORT TERM GOAL #1   Title Pt will be I protective splinting use, care and precautions left UE   Status Achieved     OT SHORT TERM GOAL #2   Title Pt will be Mod I HEP as per tendon, fracture and wound healing left hand   Status Achieved     OT SHORT TERM GOAL #3   Title Pt will be Mod I stating edema control techniques L hand   Status Achieved           OT Long Term Goals - 10/18/15 1454      OT LONG TERM GOAL #1   Title Pt will be Mod I upgraded HEP L hand   Status Achieved     OT LONG TERM GOAL #2   Title Pt will demonstrate AROM for digital flexion, extension and composite flexion/extension WFL's (w/o extensor lag noted left hand).   Status Achieved     OT LONG TERM GOAL #3   Title Pt will be Mod I left non-dominant hand use for ADL' and IADL's   Status Achieved     OT LONG TERM GOAL #4   Title Pt will be mod I scar management and desensitization techniques left hand/fingers   Status Achieved     OT LONG TERM  GOAL #5   Title Pt will demonstrate left hand grip & pinch WFL's, as compared to right dominant hand, for return to daily activities.   Status Achieved               Plan - 10/18/15 1452    Clinical Impression Statement Pt made fantastic progress from [facility] - met all goals - cont to need to do some PROM and AROM for digits flexion , scar management - but otherwise work on functional strenghtening - putty some days - pt discharge with homeprogram - too see surgeon in Nov - if still having some clicking in 3rd digit - to discuss with surgeon    OT Treatment/Interventions Self-care/ADL training;Therapeutic exercise;Patient/family education;Splinting;Manual Therapy;Ultrasound;Therapeutic exercises;Therapeutic activities;DME and/or AE instruction;Parrafin;Fluidtherapy;Scar mobilization;Passive range of motion;Moist Heat;Cryotherapy   Plan discharge with homeprogram   OT Home Exercise Plan See pt instructions   Consulted and Agree with Plan of Care Patient      Patient will benefit from skilled therapeutic intervention in order to improve the following deficits and impairments:     Visit Diagnosis: Muscle weakness (generalized)  Stiffness of left hand, not elsewhere classified  Other lack of coordination  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   Rosalyn Gess OTR/L,CLT 10/18/2015, 2:56 PM  [place] [facility] [address] [place], [address] Phone: [phone]   Fax:  [phone]  Name: Stephanie Mcglone MRN: [id number] Date of Birth: 03-Jun-1940

## 2015-10-18 NOTE — Patient Instructions (Signed)
  Pt do do tendon glides in am , and PROM for 3rd digit flexion - prior to making full fist AROM  Can do ice over A1pulley of 3rd - during day - if tender  pt to cont with scar massage and compression - silicon sleeves provided

## 2017-04-30 ENCOUNTER — Ambulatory Visit
Admission: RE | Admit: 2017-04-30 | Discharge: 2017-04-30 | Disposition: A | Discharge status: Home / Self Care | Payer: Self-pay | Source: Ambulatory Visit | Attending: Radiation Oncology | Admitting: Radiation Oncology

## 2017-04-30 ENCOUNTER — Other Ambulatory Visit: Payer: Self-pay | Admitting: Radiation Oncology

## 2017-04-30 DIAGNOSIS — C61 Malignant neoplasm of prostate: Secondary | ICD-10-CM

## 2017-05-09 ENCOUNTER — Encounter: Payer: Self-pay | Admitting: Radiation Oncology

## 2017-05-09 ENCOUNTER — Ambulatory Visit
Admission: RE | Admit: 2017-05-09 | Discharge: 2017-05-09 | Disposition: A | Discharge status: Home / Self Care | Payer: Medicare Other | Source: Ambulatory Visit | Attending: Radiation Oncology | Admitting: Radiation Oncology

## 2017-05-09 ENCOUNTER — Other Ambulatory Visit: Payer: Self-pay

## 2017-05-09 VITALS — BP 156/93 | HR 65 | Temp 98.0°F | Resp 18 | Wt 185.7 lb

## 2017-05-09 DIAGNOSIS — C61 Malignant neoplasm of prostate: Secondary | ICD-10-CM

## 2017-05-09 NOTE — Progress Notes (Signed)
Per Dr. Baruch Gouty, no charge for this visit.

## 2018-01-13 ENCOUNTER — Encounter: Payer: Self-pay | Admitting: Emergency Medicine

## 2018-01-13 ENCOUNTER — Emergency Department
Admission: EM | Admit: 2018-01-13 | Discharge: 2018-01-13 | Disposition: A | Discharge status: Home / Self Care | Payer: Medicare Other | Attending: Emergency Medicine | Admitting: Emergency Medicine

## 2018-01-13 DIAGNOSIS — L509 Urticaria, unspecified: Secondary | ICD-10-CM | POA: Insufficient documentation

## 2018-01-13 DIAGNOSIS — Z79899 Other long term (current) drug therapy: Secondary | ICD-10-CM | POA: Insufficient documentation

## 2018-01-13 DIAGNOSIS — R21 Rash and other nonspecific skin eruption: Secondary | ICD-10-CM | POA: Diagnosis present

## 2018-01-13 MED ORDER — SODIUM CHLORIDE 0.9 % IV BOLUS
500.0000 mL | Freq: Once | INTRAVENOUS | Status: AC
  Administered 2018-01-13: 500 mL via INTRAVENOUS

## 2018-01-13 MED ORDER — DIPHENHYDRAMINE HCL 50 MG/ML IJ SOLN
12.5000 mg | Freq: Once | INTRAMUSCULAR | Status: AC
  Administered 2018-01-13: 12.5 mg via INTRAVENOUS
  Filled 2018-01-13: qty 1

## 2018-01-13 MED ORDER — FAMOTIDINE IN NACL 20-0.9 MG/50ML-% IV SOLN
20.0000 mg | Freq: Once | INTRAVENOUS | Status: AC
  Administered 2018-01-13: 20 mg via INTRAVENOUS
  Filled 2018-01-13: qty 50

## 2018-01-13 MED ORDER — METHYLPREDNISOLONE 4 MG PO TBPK: ORAL_TABLET | ORAL | 0 refills | Status: DC

## 2018-01-13 MED ORDER — HYDROXYZINE HCL 10 MG/5ML PO SYRP: 20.0000 mg | ORAL_SOLUTION | Freq: Three times a day (TID) | ORAL | 0 refills | Status: AC | PRN

## 2018-01-13 MED ORDER — DEXAMETHASONE SODIUM PHOSPHATE 10 MG/ML IJ SOLN
10.0000 mg | Freq: Once | INTRAMUSCULAR | Status: AC
  Administered 2018-01-13: 10 mg via INTRAVENOUS
  Filled 2018-01-13: qty 1

## 2018-01-13 NOTE — ED Notes (Signed)
See triage note  States he woke up with red itchy rash  Arms and chest

## 2018-01-13 NOTE — ED Triage Notes (Signed)
Pt reports awoke with a rash this am over his body. Pt states that it itches horribly. Denies new foods, soaps, meds, or animals in the house.

## 2018-01-13 NOTE — ED Provider Notes (Signed)
[facility] Emergency Department Provider Note   ____________________________________________   First MD Initiated Contact with Patient 01/13/18 1219     (approximate)  I have reviewed the triage vital signs and the nursing notes.   HISTORY  Chief Complaint Rash    HPI Jose Fowler is a 77 y.o. male patient presents with diffuse urticaria which began last night.  Patient state he returned from a concert and noticed rash only on the left forearm and he apply Benadryl cream to the area.  Patient waking this morning with diffuse macular lesions and itching.  Patient denies any anaphylactic signs or symptoms.  Patient denies any recent change in laundry detergents or personal hygiene products.  Patient denies pain with this complaint.  History reviewed. No pertinent past medical history.  There are no active problems to display for this patient.   Past Surgical History:  Procedure Laterality Date  . COLONOSCOPY WITH PROPOFOL N/A 09/09/2014   Procedure: COLONOSCOPY WITH PROPOFOL;  Surgeon: Manya Silvas, MD;  Location: [facility] ENDOSCOPY;  Service: Endoscopy;  Laterality: N/A;  . HERNIA REPAIR    . PROSTATECTOMY      Prior to Admission medications   Medication Sig Start Date End Date Taking? Authorizing Provider  ascorbic acid (VITAMIN C) 1000 MG tablet Take 1,000 mg by mouth daily.    [provider]  aspirin EC 81 MG tablet Take 81 mg by mouth daily. Reported on 06/17/2015    [provider]  Cholecalciferol (VITAMIN D3) 5000 UNITS CAPS Take 5,000 Units by mouth daily.     [provider]  Garlic 161 MG CAPS Take 600 mg by mouth daily.    [provider]  glucosamine-chondroitin 500-400 MG tablet Take 1 tablet by mouth daily.    [provider]  hydrOXYzine (ATARAX) 10 MG/5ML syrup Take 10 mLs (20 mg total) by mouth 3 (three) times daily as needed for itching. 01/13/18   Sable Feil, PA-C  Lutein 40 MG  CAPS Take by mouth.    [provider]  methylPREDNISolone (MEDROL DOSEPAK) 4 MG TBPK tablet Take Tapered dose as directed 01/13/18   Sable Feil, PA-C  PLANT STANOL ESTER PO Take 1 capsule by mouth daily.     [provider]  Psyllium Fiber 0.52 g CAPS Take by mouth.    [provider]  vitamin B-12 (CYANOCOBALAMIN) 1000 MCG tablet Take 1,000 mcg by mouth daily.    [provider]    Allergies Imipramine; Codeine; and Zoladex [goserelin]  No family history on file.  Social History Social History   Tobacco Use  . Smoking status: Never Smoker  . Smokeless tobacco: Never Used  Substance Use Topics  . Alcohol use: Yes  . Drug use: Not on file    Review of Systems Constitutional: No fever/chills Eyes: No visual changes. ENT: No sore throat. Cardiovascular: Denies chest pain. Respiratory: Denies shortness of breath. Gastrointestinal: No abdominal pain.  No nausea, no vomiting.  No diarrhea.  No constipation. Genitourinary: Negative for dysuria. Musculoskeletal: Negative for back pain. Skin: Positive for rash. Neurological: Negative for headaches, focal weakness or numbness. Allergic/Immunilogical: See medication list.  ____________________________________________   PHYSICAL EXAM:  VITAL SIGNS: ED Triage Vitals  Enc Vitals Group     BP 01/13/18 1115 (!) 166/99     Pulse Rate 01/13/18 1115 63     Resp 01/13/18 1115 20     Temp 01/13/18 1115 97.9 F (36.6 C)  Temp Source 01/13/18 1115 Oral     SpO2 01/13/18 1115 97 %     Weight 01/13/18 1116 185 lb (83.9 kg)     Height 01/13/18 1116 5\' 10"  (1.778 m)     Head Circumference --      Peak Flow --      Pain Score 01/13/18 1116 0     Pain Loc --      Pain Edu? --      Excl. in [place]? --    Constitutional: Alert and oriented. Well appearing and in no acute distress. Hematological/Lymphatic/Immunilogical: No cervical lymphadenopathy. Cardiovascular: Normal rate, regular rhythm.  Grossly normal heart sounds.  Good peripheral circulation.  Elevated blood pressure. Respiratory: Normal respiratory effort.  No retractions. Lungs CTAB. Neurologic:  Normal speech and language. No gross focal neurologic deficits are appreciated. No gait instability. Skin:  Skin is warm, dry and intact.  Diffuse erythematous macular lesions.   Psychiatric: Mood and affect are normal. Speech and behavior are normal.  ____________________________________________   LABS (all labs ordered are listed, but only abnormal results are displayed)  Labs Reviewed - No data to display ____________________________________________  EKG   ____________________________________________  RADIOLOGY  ED MD interpretation:    Official radiology report(s): No results found.  ____________________________________________   PROCEDURES  Procedure(s) performed: None  Procedures  Critical Care performed: No  ____________________________________________   INITIAL IMPRESSION / ASSESSMENT AND PLAN / ED COURSE  As part of my medical decision making, I reviewed the following data within the electronic MEDICAL RECORD NUMBER    Urticaria etiology unknown.  Patient rash almost completely resolved with IV Benadryl and Decadron.  Patient given discharge care instructions advised take medication as directed.  Patient advised to follow-up with PCP if condition recurs.      ____________________________________________   FINAL CLINICAL IMPRESSION(S) / ED DIAGNOSES  Final diagnoses:  Urticaria     ED Discharge Orders         Ordered    methylPREDNISolone (MEDROL DOSEPAK) 4 MG TBPK tablet     01/13/18 1348    hydrOXYzine (ATARAX) 10 MG/5ML syrup  3 times daily PRN     01/13/18 1348           Note:  This document was prepared using Dragon voice recognition software and may include unintentional dictation errors.    Sable Feil, PA-C 01/13/18 [place], Randall An,  MD 01/13/18 [phone]

## 2018-07-31 ENCOUNTER — Other Ambulatory Visit: Payer: Self-pay | Admitting: Family Medicine

## 2018-07-31 DIAGNOSIS — N644 Mastodynia: Secondary | ICD-10-CM

## 2018-08-04 ENCOUNTER — Other Ambulatory Visit: Payer: Self-pay | Admitting: Family Medicine

## 2018-08-04 DIAGNOSIS — N644 Mastodynia: Secondary | ICD-10-CM

## 2018-08-08 ENCOUNTER — Other Ambulatory Visit: Payer: Self-pay

## 2018-08-08 ENCOUNTER — Other Ambulatory Visit: Payer: Self-pay | Admitting: "Endocrinology

## 2018-08-08 ENCOUNTER — Ambulatory Visit
Admission: RE | Admit: 2018-08-08 | Discharge: 2018-08-08 | Disposition: A | Discharge status: Home / Self Care | Payer: Medicare Other | Source: Ambulatory Visit | Attending: Family Medicine | Admitting: Family Medicine

## 2018-08-08 DIAGNOSIS — N644 Mastodynia: Secondary | ICD-10-CM | POA: Insufficient documentation

## 2018-08-08 DIAGNOSIS — E221 Hyperprolactinemia: Secondary | ICD-10-CM

## 2018-08-08 DIAGNOSIS — N62 Hypertrophy of breast: Secondary | ICD-10-CM

## 2018-08-22 ENCOUNTER — Ambulatory Visit
Admission: RE | Admit: 2018-08-22 | Discharge: 2018-08-22 | Disposition: A | Discharge status: Home / Self Care | Payer: Medicare Other | Source: Ambulatory Visit | Attending: "Endocrinology | Admitting: "Endocrinology

## 2018-08-22 ENCOUNTER — Other Ambulatory Visit: Payer: Self-pay

## 2018-08-22 DIAGNOSIS — N62 Hypertrophy of breast: Secondary | ICD-10-CM | POA: Diagnosis present

## 2018-08-22 DIAGNOSIS — E221 Hyperprolactinemia: Secondary | ICD-10-CM

## 2018-08-22 LAB — POCT I-STAT CREATININE: Creatinine, Ser: 0.9 mg/dL (ref 0.61–1.24)

## 2018-08-22 MED ORDER — GADOBUTROL 1 MMOL/ML IV SOLN
7.5000 mL | Freq: Once | INTRAVENOUS | Status: AC | PRN
  Administered 2018-08-22: 8 mL via INTRAVENOUS

## 2019-08-13 ENCOUNTER — Other Ambulatory Visit: Payer: Self-pay | Admitting: "Endocrinology

## 2019-08-13 DIAGNOSIS — D497 Neoplasm of unspecified behavior of endocrine glands and other parts of nervous system: Secondary | ICD-10-CM

## 2019-09-01 ENCOUNTER — Other Ambulatory Visit: Payer: Self-pay

## 2019-09-01 ENCOUNTER — Ambulatory Visit
Admission: RE | Admit: 2019-09-01 | Discharge: 2019-09-01 | Disposition: A | Discharge status: Home / Self Care | Payer: Medicare Other | Source: Ambulatory Visit | Attending: "Endocrinology | Admitting: "Endocrinology

## 2019-09-01 DIAGNOSIS — D497 Neoplasm of unspecified behavior of endocrine glands and other parts of nervous system: Secondary | ICD-10-CM | POA: Insufficient documentation

## 2019-09-01 MED ORDER — GADOBUTROL 1 MMOL/ML IV SOLN
7.5000 mL | Freq: Once | INTRAVENOUS | Status: AC | PRN
  Administered 2019-09-01: 7.5 mL via INTRAVENOUS

## 2020-12-13 IMAGING — MG DIGITAL DIAGNOSTIC BILATERAL MAMMOGRAM WITH TOMO AND CAD
6 of 10 series · 6 of 30 positions shown · non-contrast
Comparison: Previous exam(s).

CLINICAL DATA: Focal pain left breast.

EXAM:
DIGITAL DIAGNOSTIC BILATERAL MAMMOGRAM WITH CAD AND TOMO
ULTRASOUND LEFT BREAST

[L TAN synth-2D]
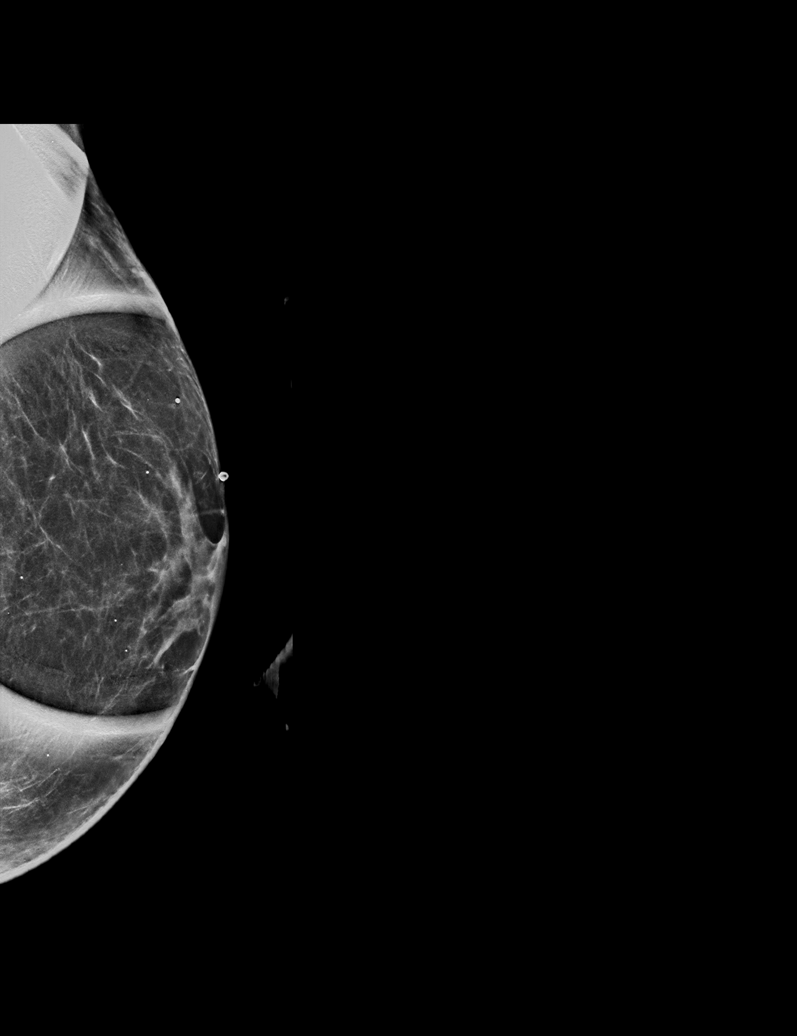

[L MLO synth-2D]
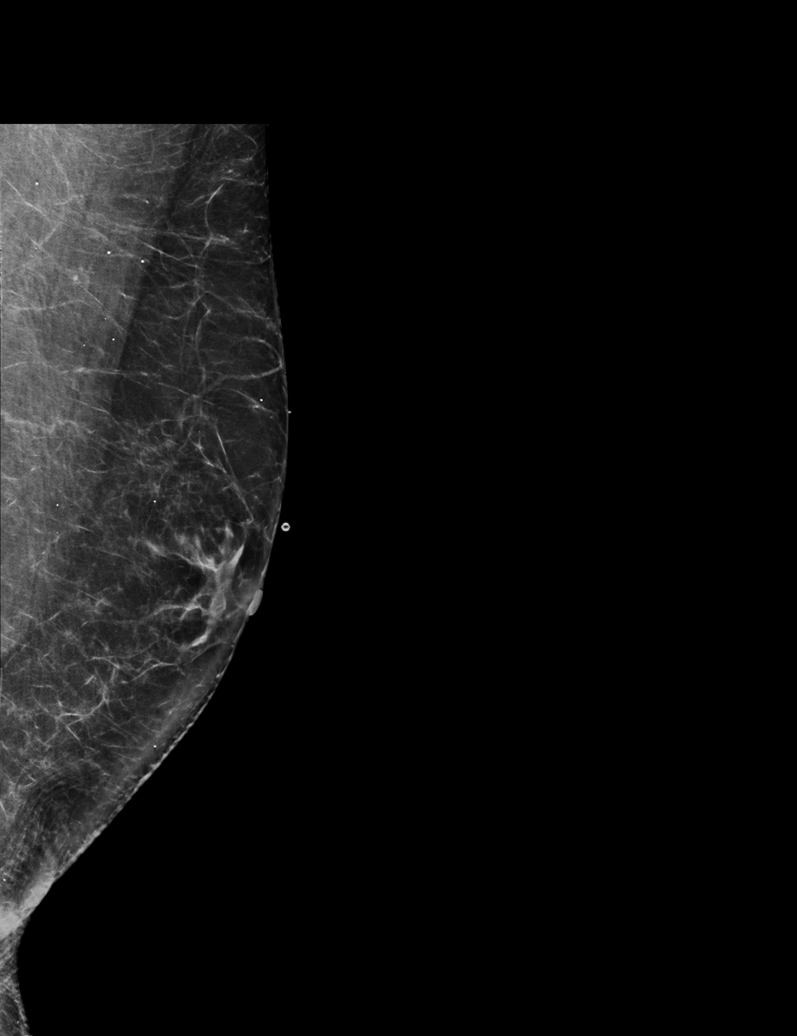

[L CC synth-2D]
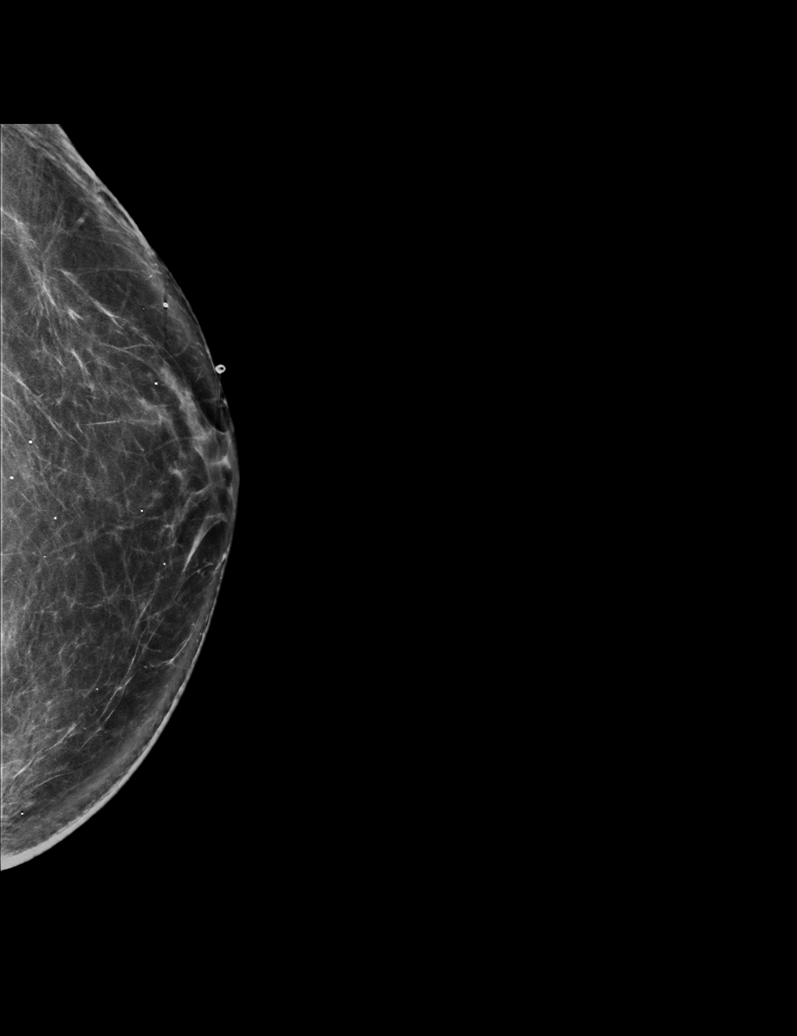

[R MLO synth-2D]
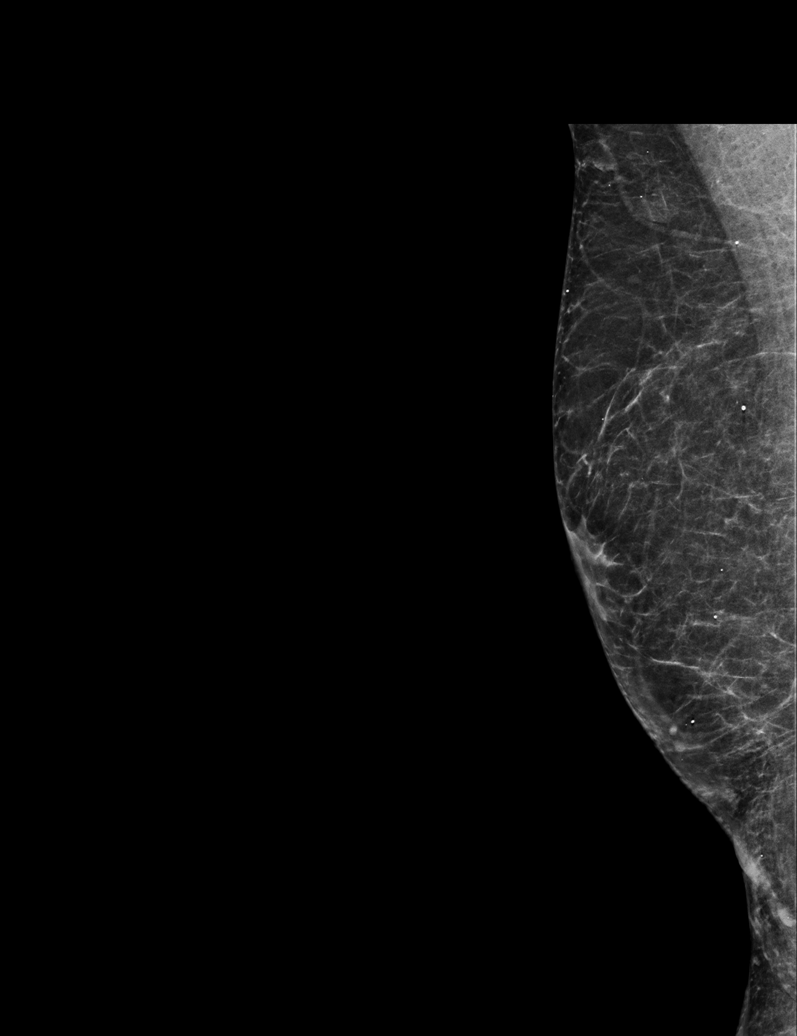

[R CC synth-2D]
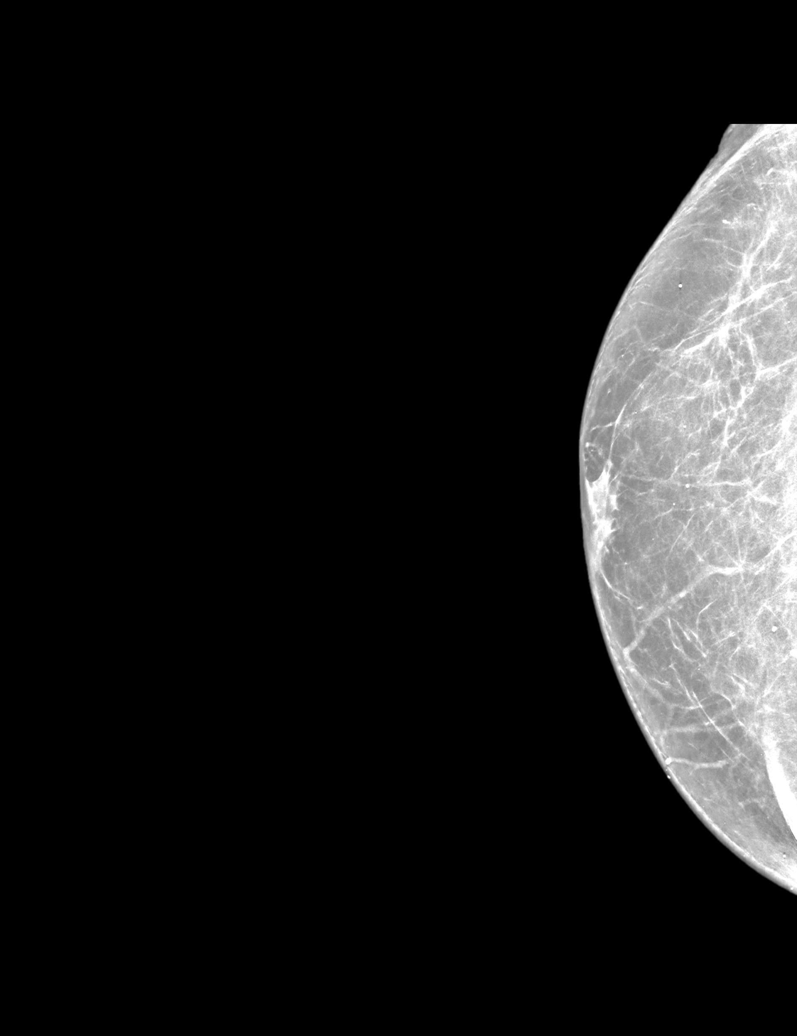

[R CC tomo · tomo slice 30/59.0]
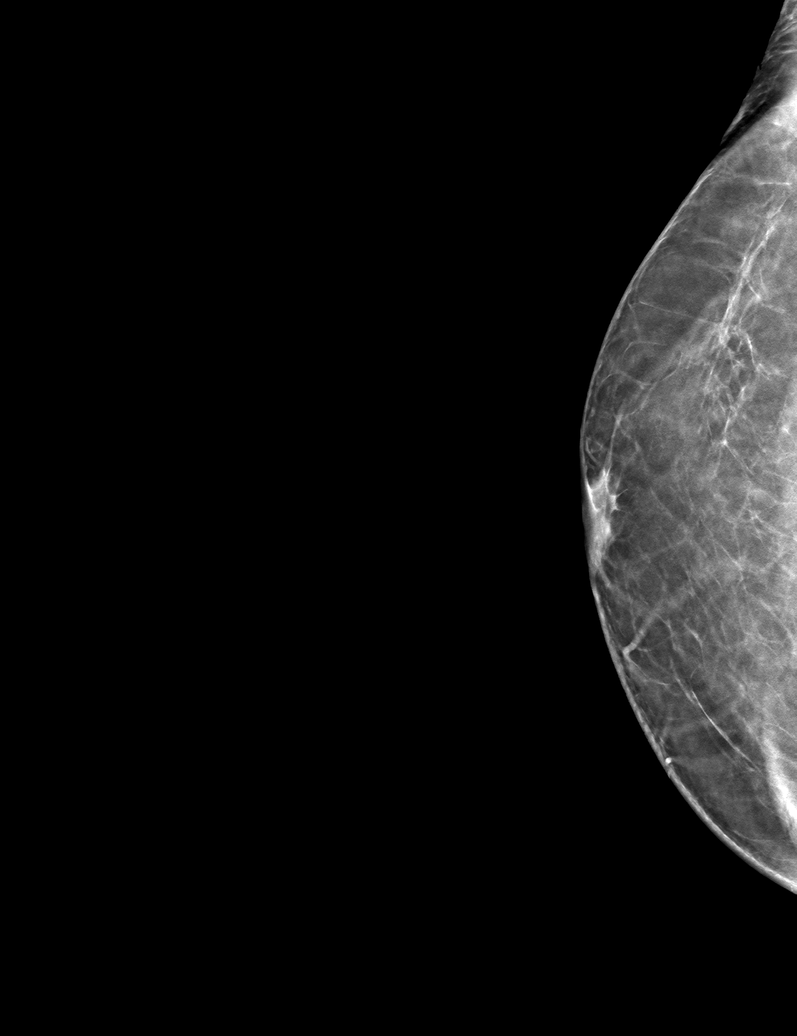

[6 of 30 positions shown; findings below may reference images not displayed]

ACR Breast Density Category b: There are scattered areas of
fibroglandular density.
FINDINGS: Cc and MLO views of bilateral breasts, spot tangential view of left
breast are submitted. Mild bilateral retroareolar gynecomastia is
identified. No suspicious abnormalities noted.

Mammographic images were processed with CAD.

Targeted ultrasound is performed, showing no focal abnormal discrete
cystic or solid lesion in focal area pain/lump at left breast 2 and
3 o'clock periareolar region.
IMPRESSION: Benign findings.

RECOMMENDATION:
Management on clinical basis.

I have discussed the findings and recommendations with the patient.
Results were also provided in writing at the conclusion of the
visit. If applicable, a reminder letter will be sent to the patient
regarding the next appointment.

BI-RADS CATEGORY  2: Benign.

## 2021-11-01 ENCOUNTER — Encounter: Payer: Self-pay | Admitting: Ophthalmology

## 2021-11-06 NOTE — Discharge Instructions (Signed)

## 2021-11-08 ENCOUNTER — Ambulatory Visit: Payer: Medicare Other | Admitting: Anesthesiology

## 2021-11-08 ENCOUNTER — Ambulatory Visit
Admission: RE | Admit: 2021-11-08 | Discharge: 2021-11-08 | Disposition: A | Discharge status: Home / Self Care | Payer: Medicare Other | Attending: Ophthalmology | Admitting: Ophthalmology

## 2021-11-08 ENCOUNTER — Other Ambulatory Visit: Payer: Self-pay

## 2021-11-08 ENCOUNTER — Encounter: Payer: Self-pay | Admitting: Ophthalmology

## 2021-11-08 ENCOUNTER — Encounter
Admission: RE | Disposition: A | Discharge status: Home / Self Care | Payer: Self-pay | Source: Home / Self Care | Attending: Ophthalmology

## 2021-11-08 DIAGNOSIS — H2512 Age-related nuclear cataract, left eye: Secondary | ICD-10-CM | POA: Insufficient documentation

## 2021-11-08 HISTORY — PX: CATARACT EXTRACTION W/PHACO: SHX586

## 2021-11-08 HISTORY — DX: Other specified health status: Z78.9

## 2021-11-08 SURGERY — PHACOEMULSIFICATION, CATARACT, WITH IOL INSERTION
Anesthesia: Monitor Anesthesia Care | Site: Eye | Laterality: Left

## 2021-11-08 MED ORDER — SIGHTPATH DOSE#1 BSS IO SOLN
INTRAOCULAR | Status: DC | PRN
  Administered 2021-11-08: 15 mL

## 2021-11-08 MED ORDER — FENTANYL CITRATE (PF) 100 MCG/2ML IJ SOLN
INTRAMUSCULAR | Status: DC | PRN
  Administered 2021-11-08: 50 ug via INTRAVENOUS

## 2021-11-08 MED ORDER — SIGHTPATH DOSE#1 NA HYALUR & NA CHOND-NA HYALUR IO KIT
PACK | INTRAOCULAR | Status: DC | PRN
  Administered 2021-11-08: 1 via OPHTHALMIC

## 2021-11-08 MED ORDER — SIGHTPATH DOSE#1 BSS IO SOLN
INTRAOCULAR | Status: DC | PRN
  Administered 2021-11-08: 1 mL via INTRAMUSCULAR

## 2021-11-08 MED ORDER — ARMC OPHTHALMIC DILATING DROPS
1.0000 | OPHTHALMIC | Status: DC | PRN
  Administered 2021-11-08 (×3): 1 via OPHTHALMIC

## 2021-11-08 MED ORDER — CEFUROXIME OPHTHALMIC INJECTION 1 MG/0.1 ML
INJECTION | OPHTHALMIC | Status: DC | PRN
  Administered 2021-11-08: 0.1 mL via INTRACAMERAL

## 2021-11-08 MED ORDER — SIGHTPATH DOSE#1 BSS IO SOLN
INTRAOCULAR | Status: DC | PRN
  Administered 2021-11-08: 58 mL via OPHTHALMIC

## 2021-11-08 MED ORDER — MIDAZOLAM HCL 5 MG/5ML IJ SOLN
INTRAMUSCULAR | Status: DC | PRN
  Administered 2021-11-08: 1 mg via INTRAVENOUS

## 2021-11-08 MED ORDER — TETRACAINE HCL 0.5 % OP SOLN
1.0000 [drp] | OPHTHALMIC | Status: DC | PRN
  Administered 2021-11-08 (×3): 1 [drp] via OPHTHALMIC

## 2021-11-08 MED ORDER — BRIMONIDINE TARTRATE-TIMOLOL 0.2-0.5 % OP SOLN
OPHTHALMIC | Status: DC | PRN
  Administered 2021-11-08: 1 [drp] via OPHTHALMIC

## 2021-11-08 SURGICAL SUPPLY — 10 items
CATARACT SUITE SIGHTPATH (MISCELLANEOUS) ×1 IMPLANT
FEE CATARACT SUITE SIGHTPATH (MISCELLANEOUS) ×1 IMPLANT
GLOVE SRG 8 PF TXTR STRL LF DI (GLOVE) ×1 IMPLANT
GLOVE SURG ENC TEXT LTX SZ7.5 (GLOVE) ×1 IMPLANT
GLOVE SURG UNDER POLY LF SZ8 (GLOVE) ×1
LENS IOL TECNIS EYHANCE 21.0 (Intraocular Lens) IMPLANT
NDL FILTER BLUNT 18X1 1/2 (NEEDLE) ×1 IMPLANT
NEEDLE FILTER BLUNT 18X1 1/2 (NEEDLE) ×1 IMPLANT
SYR 3ML LL SCALE MARK (SYRINGE) ×1 IMPLANT
WATER STERILE IRR 250ML POUR (IV SOLUTION) ×1 IMPLANT

## 2021-11-08 NOTE — Anesthesia Preprocedure Evaluation (Signed)
Anesthesia Evaluation  Patient identified by MRN, date of birth, ID band Patient awake    Reviewed: Allergy & Precautions, NPO status , Patient's Chart, lab work & pertinent test results  Airway Mallampati: III  TM Distance: >3 FB Neck ROM: full    Dental  (+) Teeth Intact   Pulmonary neg pulmonary ROS,    Pulmonary exam normal        Cardiovascular negative cardio ROS Normal cardiovascular exam     Neuro/Psych negative neurological ROS  negative psych ROS   GI/Hepatic negative GI ROS, Neg liver ROS,   Endo/Other  negative endocrine ROS  Renal/GU      Musculoskeletal   Abdominal   Peds  Hematology negative hematology ROS (+)   Anesthesia Other Findings Past Medical History: No date: Medical history non-contributory  Past Surgical History: 09/09/2014: COLONOSCOPY WITH PROPOFOL; N/A     Comment:  Procedure: COLONOSCOPY WITH PROPOFOL;  Surgeon: Manya Silvas, MD;  Location: [facility] ENDOSCOPY;  Service:               Endoscopy;  Laterality: N/A; No date: HERNIA REPAIR No date: PROSTATECTOMY  BMI    Body Mass Index: 26.39 kg/m      Reproductive/Obstetrics negative OB ROS                             Anesthesia Physical Anesthesia Plan  ASA: 1  Anesthesia Plan: MAC   Post-op Pain Management:    Induction: Intravenous  PONV Risk Score and Plan: Midazolam and Treatment may vary due to age or medical condition  Airway Management Planned: LMA  Additional Equipment:   Intra-op Plan:   Post-operative Plan: Extubation in OR  Informed Consent: I have reviewed the patients History and Physical, chart, labs and discussed the procedure including the risks, benefits and alternatives for the proposed anesthesia with the patient or authorized representative who has indicated his/her understanding and acceptance.     Dental Advisory Given  Plan Discussed with:  Anesthesiologist, CRNA and Surgeon  Anesthesia Plan Comments: (Patient consented for risks of anesthesia including but not limited to:  - adverse reactions to medications - damage to eyes, teeth, lips or other oral mucosa - nerve damage due to positioning  - sore throat or hoarseness - Damage to heart, brain, nerves, lungs, other parts of body or loss of life  Patient voiced understanding.)        Anesthesia Quick Evaluation

## 2021-11-08 NOTE — Op Note (Signed)
OPERATIVE NOTE  Jose Fowler [id number] 11/08/2021   PREOPERATIVE DIAGNOSIS:  Nuclear sclerotic cataract left eye. H25.12   POSTOPERATIVE DIAGNOSIS:    Nuclear sclerotic cataract left eye.     PROCEDURE:  Phacoemusification with posterior chamber intraocular lens placement of the left eye  Ultrasound time: Procedure(s): CATARACT EXTRACTION PHACO AND INTRAOCULAR LENS PLACEMENT (IOC) LEFT 6.63 01:00.4 (Left)  LENS:   Implant Name Type Inv. Item Serial No. Manufacturer Lot No. LRB No. Used Action  LENS IOL TECNIS EYHANCE 21.0 - [id number] Intraocular Lens LENS IOL TECNIS EYHANCE 21.0 [id number] [employer]  Left 1 Implanted      SURGEON:  Wyonia Hough, MD   ANESTHESIA:  Topical with tetracaine drops and 2% Xylocaine jelly, augmented with 1% preservative-free intracameral lidocaine.    COMPLICATIONS:  None.   DESCRIPTION OF PROCEDURE:  The patient was identified in the holding room and transported to the operating room and placed in the supine position under the operating microscope.  The left eye was identified as the operative eye and it was prepped and draped in the usual sterile ophthalmic fashion.   A 1 millimeter clear-corneal paracentesis was made at the 1:30 position.  0.5 ml of preservative-free 1% lidocaine was injected into the anterior chamber.  The anterior chamber was filled with Viscoat viscoelastic.  A 2.4 millimeter keratome was used to make a near-clear corneal incision at the 10:30 position.  .  A curvilinear capsulorrhexis was made with a cystotome and capsulorrhexis forceps.  Balanced salt solution was used to hydrodissect and hydrodelineate the nucleus.   Phacoemulsification was then used in stop and chop fashion to remove the lens nucleus and epinucleus.  The remaining cortex was then removed using the irrigation and aspiration handpiece. Provisc was then placed into the capsular bag to distend it for lens placement.  A lens was then injected into the  capsular bag.  The remaining viscoelastic was aspirated.   Wounds were hydrated with balanced salt solution.  The anterior chamber was inflated to a physiologic pressure with balanced salt solution.  No wound leaks were noted. Cefuroxime 0.1 ml of a '10mg'$ /ml solution was injected into the anterior chamber for a dose of 1 mg of intracameral antibiotic at the completion of the case.   Timolol and Brimonidine drops were applied to the eye.  The patient was taken to the recovery room in stable condition without complications of anesthesia or surgery.  Jose Fowler 11/08/2021, 2:26 PM

## 2021-11-08 NOTE — Anesthesia Postprocedure Evaluation (Signed)
Anesthesia Post Note  Patient: Jose Fowler  Procedure(s) Performed: CATARACT EXTRACTION PHACO AND INTRAOCULAR LENS PLACEMENT (IOC) LEFT 6.63 01:00.4 (Left: Eye)  Patient location during evaluation: PACU Anesthesia Type: MAC Level of consciousness: awake and alert Pain management: pain level controlled Vital Signs Assessment: post-procedure vital signs reviewed and stable Respiratory status: spontaneous breathing, nonlabored ventilation, respiratory function stable and patient connected to nasal cannula oxygen Cardiovascular status: stable and blood pressure returned to baseline Postop Assessment: no apparent nausea or vomiting Anesthetic complications: no   No notable events documented.   Last Vitals:  Vitals:   11/08/21 1428 11/08/21 1433  BP: 135/83 (!) 144/92  Pulse: (!) 57 (!) 59  Resp: 17 16  Temp: (!) 36.1 C   SpO2: 94% 95%    Last Pain:  Vitals:   11/08/21 1433  TempSrc:   PainSc: 0-No pain                 Ilene Qua

## 2021-11-08 NOTE — Transfer of Care (Signed)
Immediate Anesthesia Transfer of Care Note  Patient: Jose Fowler  Procedure(s) Performed: CATARACT EXTRACTION PHACO AND INTRAOCULAR LENS PLACEMENT (IOC) LEFT 6.63 01:00.4 (Left: Eye)  Patient Location: PACU  Anesthesia Type:MAC  Level of Consciousness: awake, alert  and oriented  Airway & Oxygen Therapy: Patient Spontanous Breathing  Post-op Assessment: Report given to RN and Post -op Vital signs reviewed and stable  Post vital signs: stable  Last Vitals:  Vitals Value Taken Time  BP 135/83 11/08/21 1428  Temp 36.1 C 11/08/21 1428  Pulse 58 11/08/21 1431  Resp 16 11/08/21 1431  SpO2 96 % 11/08/21 1431  Vitals shown include unvalidated device data.  Last Pain:  Vitals:   11/08/21 1428  TempSrc:   PainSc: 0-No pain         Complications: No notable events documented.

## 2021-11-08 NOTE — H&P (Signed)
[facility]   Primary Care Physician:  Donnamarie Rossetti, PA-C Ophthalmologist: Dr. Leandrew Koyanagi  Pre-Procedure History & Physical: HPI:  Jose Fowler is a 81 y.o. male here for ophthalmic surgery.   Past Medical History:  Diagnosis Date   Medical history non-contributory     Past Surgical History:  Procedure Laterality Date   COLONOSCOPY WITH PROPOFOL N/A 09/09/2014   Procedure: COLONOSCOPY WITH PROPOFOL;  Surgeon: Manya Silvas, MD;  Location: [facility] ENDOSCOPY;  Service: Endoscopy;  Laterality: N/A;   HERNIA REPAIR     PROSTATECTOMY      Prior to Admission medications   Medication Sig Start Date End Date Taking? Authorizing Provider  ascorbic acid (VITAMIN C) 1000 MG tablet Take 1,000 mg by mouth daily.   Yes [provider]  aspirin EC 81 MG tablet Take 81 mg by mouth daily. Reported on 06/17/2015   Yes [provider]  cetirizine (ZYRTEC) 10 MG tablet Take 10 mg by mouth daily.   Yes [provider]  Cholecalciferol (VITAMIN D3) 5000 UNITS CAPS Take 5,000 Units by mouth daily.    Yes [provider]  Garlic 932 MG CAPS Take 600 mg by mouth daily.   Yes [provider]  glucosamine-chondroitin 500-400 MG tablet Take 1 tablet by mouth daily.   Yes [provider]  Lutein 40 MG CAPS Take by mouth.   Yes [provider]  Multiple Vitamins-Minerals (CENTRUM SILVER PO) Take by mouth daily.   Yes [provider]  Psyllium Fiber 0.52 g CAPS Take by mouth.   Yes [provider]  Turmeric 500 MG TABS Take by mouth daily.   Yes [provider]  vitamin B-12 (CYANOCOBALAMIN) 1000 MCG tablet Take 1,000 mcg by mouth daily.   Yes [provider]  hydrOXYzine (ATARAX) 10 MG/5ML syrup Take 10 mLs (20 mg total) by mouth 3 (three) times daily as needed for itching. Patient not taking: Reported on 11/01/2021 01/13/18   Sable Feil, PA-C    Allergies as of 09/20/2021 -  Review Complete 01/13/2018  Allergen Reaction Noted   Imipramine Other (See Comments) 09/08/2014   Codeine Palpitations 09/08/2014   Zoladex [goserelin] Palpitations and Other (See Comments) 09/08/2014    History reviewed. No pertinent family history.  Social History   Socioeconomic History   Marital status: Married    Spouse name: Not on file   Number of children: Not on file   Years of education: Not on file   Highest education level: Not on file  Occupational History   Not on file  Tobacco Use   Smoking status: Never   Smokeless tobacco: Never  Vaping Use   Vaping Use: Never used  Substance and Sexual Activity   Alcohol use: Yes    Alcohol/week: 12.0 standard drinks of alcohol    Types: 12 Glasses of wine per week   Drug use: Not on file   Sexual activity: Not on file  Other Topics Concern   Not on file  Social History Narrative   Not on file   Social Determinants of Health   Financial Resource Strain: Not on file  Food Insecurity: Not on file  Transportation Needs: Not on file  Physical Activity: Not on file  Stress: Not on file  Social Connections: Not on file  Intimate Partner Violence: Not on file    Review of Systems: See HPI, otherwise negative ROS  Physical Exam: BP (!) 155/78   Pulse 61   Temp  98.1 F (36.7 C) (Temporal)   Ht '5\' 10"'$  (1.778 m)   Wt 83.4 kg   SpO2 100%   BMI 26.39 kg/m  General:   Alert,  pleasant and cooperative in NAD Head:  Normocephalic and atraumatic. Lungs:  Clear to auscultation.    Heart:  Regular rate and rhythm.   Impression/Plan: Jose Fowler is here for ophthalmic surgery.  Risks, benefits, limitations, and alternatives regarding ophthalmic surgery have been reviewed with the patient.  Questions have been answered.  All parties agreeable.   Leandrew Koyanagi, MD  11/08/2021, 2:06 PM

## 2021-11-09 ENCOUNTER — Encounter: Payer: Self-pay | Admitting: Ophthalmology

## 2021-11-20 NOTE — Discharge Instructions (Signed)

## 2021-11-22 ENCOUNTER — Encounter: Payer: Self-pay | Admitting: Ophthalmology

## 2021-11-22 ENCOUNTER — Ambulatory Visit
Admission: RE | Admit: 2021-11-22 | Discharge: 2021-11-22 | Disposition: A | Discharge status: Home / Self Care | Payer: Medicare Other | Attending: Ophthalmology | Admitting: Ophthalmology

## 2021-11-22 ENCOUNTER — Ambulatory Visit: Payer: Medicare Other | Admitting: Anesthesiology

## 2021-11-22 ENCOUNTER — Other Ambulatory Visit: Payer: Self-pay

## 2021-11-22 ENCOUNTER — Encounter
Admission: RE | Disposition: A | Discharge status: Home / Self Care | Payer: Self-pay | Source: Home / Self Care | Attending: Ophthalmology

## 2021-11-22 DIAGNOSIS — H2511 Age-related nuclear cataract, right eye: Secondary | ICD-10-CM | POA: Diagnosis present

## 2021-11-22 DIAGNOSIS — Z7982 Long term (current) use of aspirin: Secondary | ICD-10-CM | POA: Diagnosis not present

## 2021-11-22 HISTORY — PX: CATARACT EXTRACTION W/PHACO: SHX586

## 2021-11-22 SURGERY — PHACOEMULSIFICATION, CATARACT, WITH IOL INSERTION
Anesthesia: Monitor Anesthesia Care | Site: Eye | Laterality: Right

## 2021-11-22 MED ORDER — MIDAZOLAM HCL 2 MG/2ML IJ SOLN
INTRAMUSCULAR | Status: DC | PRN
  Administered 2021-11-22: 1 mg via INTRAVENOUS

## 2021-11-22 MED ORDER — SIGHTPATH DOSE#1 NA HYALUR & NA CHOND-NA HYALUR IO KIT
PACK | INTRAOCULAR | Status: DC | PRN
  Administered 2021-11-22: 1 via OPHTHALMIC

## 2021-11-22 MED ORDER — FENTANYL CITRATE (PF) 100 MCG/2ML IJ SOLN
INTRAMUSCULAR | Status: DC | PRN
  Administered 2021-11-22: 50 ug via INTRAVENOUS

## 2021-11-22 MED ORDER — ARMC OPHTHALMIC DILATING DROPS
1.0000 | OPHTHALMIC | Status: DC | PRN
  Administered 2021-11-22 (×3): 1 via OPHTHALMIC

## 2021-11-22 MED ORDER — TETRACAINE HCL 0.5 % OP SOLN
1.0000 [drp] | OPHTHALMIC | Status: DC | PRN
  Administered 2021-11-22 (×3): 1 [drp] via OPHTHALMIC

## 2021-11-22 MED ORDER — SIGHTPATH DOSE#1 BSS IO SOLN
INTRAOCULAR | Status: DC | PRN
  Administered 2021-11-22: 15 mL via INTRAOCULAR

## 2021-11-22 MED ORDER — ONDANSETRON HCL 4 MG/2ML IJ SOLN: 4.0000 mg | Freq: Once | INTRAMUSCULAR | Status: DC | PRN

## 2021-11-22 MED ORDER — SIGHTPATH DOSE#1 BSS IO SOLN
INTRAOCULAR | Status: DC | PRN
  Administered 2021-11-22: 79 mL via OPHTHALMIC

## 2021-11-22 MED ORDER — FENTANYL CITRATE PF 50 MCG/ML IJ SOSY: 25.0000 ug | PREFILLED_SYRINGE | INTRAMUSCULAR | Status: DC | PRN

## 2021-11-22 MED ORDER — BRIMONIDINE TARTRATE-TIMOLOL 0.2-0.5 % OP SOLN
OPHTHALMIC | Status: DC | PRN
  Administered 2021-11-22: 1 [drp] via OPHTHALMIC

## 2021-11-22 MED ORDER — CEFUROXIME OPHTHALMIC INJECTION 1 MG/0.1 ML
INJECTION | OPHTHALMIC | Status: DC | PRN
  Administered 2021-11-22: 0.1 mL via INTRACAMERAL

## 2021-11-22 MED ORDER — SIGHTPATH DOSE#1 BSS IO SOLN
INTRAOCULAR | Status: DC | PRN
  Administered 2021-11-22: 2 mL

## 2021-11-22 MED ORDER — LACTATED RINGERS IV SOLN: INTRAVENOUS | Status: DC

## 2021-11-22 SURGICAL SUPPLY — 10 items
CATARACT SUITE SIGHTPATH (MISCELLANEOUS) ×1 IMPLANT
FEE CATARACT SUITE SIGHTPATH (MISCELLANEOUS) ×1 IMPLANT
GLOVE SRG 8 PF TXTR STRL LF DI (GLOVE) ×1 IMPLANT
GLOVE SURG ENC TEXT LTX SZ7.5 (GLOVE) ×1 IMPLANT
GLOVE SURG UNDER POLY LF SZ8 (GLOVE) ×1
LENS IOL TECNIS EYHANCE 20.5 (Intraocular Lens) IMPLANT
NDL FILTER BLUNT 18X1 1/2 (NEEDLE) ×1 IMPLANT
NEEDLE FILTER BLUNT 18X1 1/2 (NEEDLE) ×1 IMPLANT
SYR 3ML LL SCALE MARK (SYRINGE) ×1 IMPLANT
WATER STERILE IRR 250ML POUR (IV SOLUTION) ×1 IMPLANT

## 2021-11-22 NOTE — Op Note (Signed)
  LOCATION:  [place]   PREOPERATIVE DIAGNOSIS:    Nuclear sclerotic cataract right eye. H25.11   POSTOPERATIVE DIAGNOSIS:  Nuclear sclerotic cataract right eye.     PROCEDURE:  Phacoemusification with posterior chamber intraocular lens placement of the right eye   ULTRASOUND TIME: Procedure(s): CATARACT EXTRACTION PHACO AND INTRAOCULAR LENS PLACEMENT (IOC) RIGHT 11.12 01:26.1 (Right)  LENS:   Implant Name Type Inv. Item Serial No. Manufacturer Lot No. LRB No. Used Action  LENS IOL TECNIS EYHANCE 20.5 - [id number] Intraocular Lens LENS IOL TECNIS EYHANCE 20.5 [id number] [employer]  Right 1 Implanted         SURGEON:  Wyonia Hough, MD   ANESTHESIA:  Topical with tetracaine drops and 2% Xylocaine jelly, augmented with 1% preservative-free intracameral lidocaine.    COMPLICATIONS:  None.   DESCRIPTION OF PROCEDURE:  The patient was identified in the holding room and transported to the operating room and placed in the supine position under the operating microscope.  The right eye was identified as the operative eye and it was prepped and draped in the usual sterile ophthalmic fashion.   A 1 millimeter clear-corneal paracentesis was made at the 12:00 position.  0.5 ml of preservative-free 1% lidocaine was injected into the anterior chamber. The anterior chamber was filled with Viscoat viscoelastic.  A 2.4 millimeter keratome was used to make a near-clear corneal incision at the 9:00 position.  A curvilinear capsulorrhexis was made with a cystotome and capsulorrhexis forceps.  Balanced salt solution was used to hydrodissect and hydrodelineate the nucleus.   Phacoemulsification was then used in stop and chop fashion to remove the lens nucleus and epinucleus.  The remaining cortex was then removed using the irrigation and aspiration handpiece. Provisc was then placed into the capsular bag to distend it for lens placement.  A lens was then injected into the capsular bag.   The remaining viscoelastic was aspirated.   Wounds were hydrated with balanced salt solution.  The anterior chamber was inflated to a physiologic pressure with balanced salt solution.  No wound leaks were noted. Cefuroxime 0.1 ml of a '10mg'$ /ml solution was injected into the anterior chamber for a dose of 1 mg of intracameral antibiotic at the completion of the case.   Timolol and Brimonidine drops were applied to the eye.  The patient was taken to the recovery room in stable condition without complications of anesthesia or surgery.   Viral Schramm 11/22/2021, 10:57 AM

## 2021-11-22 NOTE — Transfer of Care (Signed)
Immediate Anesthesia Transfer of Care Note  Patient: Jose Fowler  Procedure(s) Performed: CATARACT EXTRACTION PHACO AND INTRAOCULAR LENS PLACEMENT (IOC) RIGHT 11.12 01:26.1 (Right: Eye)  Patient Location: PACU  Anesthesia Type: MAC  Level of Consciousness: awake, alert  and patient cooperative  Airway and Oxygen Therapy: Patient Spontanous Breathing and Patient connected to supplemental oxygen  Post-op Assessment: Post-op Vital signs reviewed, Patient's Cardiovascular Status Stable, Respiratory Function Stable, Patent Airway and No signs of Nausea or vomiting  Post-op Vital Signs: Reviewed and stable  Complications: No notable events documented.

## 2021-11-22 NOTE — Anesthesia Postprocedure Evaluation (Signed)
Anesthesia Post Note  Patient: Jose Fowler  Procedure(s) Performed: CATARACT EXTRACTION PHACO AND INTRAOCULAR LENS PLACEMENT (IOC) RIGHT 11.12 01:26.1 (Right: Eye)  Patient location during evaluation: PACU Anesthesia Type: MAC Level of consciousness: awake and alert Pain management: pain level controlled Vital Signs Assessment: post-procedure vital signs reviewed and stable Respiratory status: spontaneous breathing, nonlabored ventilation, respiratory function stable and patient connected to nasal cannula oxygen Cardiovascular status: blood pressure returned to baseline and stable Postop Assessment: no apparent nausea or vomiting Anesthetic complications: no   No notable events documented.   Last Vitals:  Vitals:   11/22/21 1100 11/22/21 1108  BP: (!) 149/85 (!) 146/81  Pulse: (!) 52 (!) 52  Resp: 14 10  Temp: (!) 36.4 C   SpO2: 97% 94%    Last Pain:  Vitals:   11/22/21 1108  TempSrc:   PainSc: 0-No pain                 Molli Barrows

## 2021-11-22 NOTE — H&P (Signed)
[facility]   Primary Care Physician:  Donnamarie Rossetti, PA-C Ophthalmologist: Dr. Leandrew Koyanagi  Pre-Procedure History & Physical: HPI:  Jose Fowler is a 81 y.o. male here for ophthalmic surgery.   Past Medical History:  Diagnosis Date   Medical history non-contributory     Past Surgical History:  Procedure Laterality Date   CATARACT EXTRACTION W/PHACO Left 11/08/2021   Procedure: CATARACT EXTRACTION PHACO AND INTRAOCULAR LENS PLACEMENT (IOC) LEFT 6.63 01:00.4;  Surgeon: Leandrew Koyanagi, MD;  Location: [facility];  Service: Ophthalmology;  Laterality: Left;   COLONOSCOPY WITH PROPOFOL N/A 09/09/2014   Procedure: COLONOSCOPY WITH PROPOFOL;  Surgeon: Manya Silvas, MD;  Location[facility] ENDOSCOPY;  Service: Endoscopy;  Laterality: N/A;   HERNIA REPAIR     PROSTATECTOMY      Prior to Admission medications   Medication Sig Start Date End Date Taking? Authorizing Provider  ascorbic acid (VITAMIN C) 1000 MG tablet Take 1,000 mg by mouth daily.   Yes [provider]  aspirin EC 81 MG tablet Take 81 mg by mouth daily. Reported on 06/17/2015   Yes [provider]  cetirizine (ZYRTEC) 10 MG tablet Take 10 mg by mouth daily.   Yes [provider]  Cholecalciferol (VITAMIN D3) 5000 UNITS CAPS Take 5,000 Units by mouth daily.    Yes [provider]  Garlic 921 MG CAPS Take 600 mg by mouth daily.   Yes [provider]  glucosamine-chondroitin 500-400 MG tablet Take 1 tablet by mouth daily.   Yes [provider]  Lutein 40 MG CAPS Take by mouth.   Yes [provider]  Multiple Vitamins-Minerals (CENTRUM SILVER PO) Take by mouth daily.   Yes [provider]  Psyllium Fiber 0.52 g CAPS Take by mouth.   Yes [provider]  Turmeric 500 MG TABS Take by mouth daily.   Yes [provider]  vitamin B-12 (CYANOCOBALAMIN) 1000 MCG tablet Take 1,000 mcg by mouth daily.   Yes  [provider]  hydrOXYzine (ATARAX) 10 MG/5ML syrup Take 10 mLs (20 mg total) by mouth 3 (three) times daily as needed for itching. Patient not taking: Reported on 11/01/2021 01/13/18   Sable Feil, PA-C    Allergies as of 09/20/2021 - Review Complete 01/13/2018  Allergen Reaction Noted   Imipramine Other (See Comments) 09/08/2014   Codeine Palpitations 09/08/2014   Zoladex [goserelin] Palpitations and Other (See Comments) 09/08/2014    No family history on file.  Social History   Socioeconomic History   Marital status: Married    Spouse name: Not on file   Number of children: Not on file   Years of education: Not on file   Highest education level: Not on file  Occupational History   Not on file  Tobacco Use   Smoking status: Never   Smokeless tobacco: Never  Vaping Use   Vaping Use: Never used  Substance and Sexual Activity   Alcohol use: Yes    Alcohol/week: 12.0 standard drinks of alcohol    Types: 12 Glasses of wine per week   Drug use: Not on file   Sexual activity: Not on file  Other Topics Concern   Not on file  Social History Narrative   Not on file   Social Determinants of Health   Financial Resource Strain: Not on file  Food Insecurity: Not on file  Transportation Needs: Not on file  Physical Activity: Not on file  Stress: Not on file  Social Connections: Not on file  Intimate Partner Violence: Not on file    Review of Systems: See HPI, otherwise negative ROS  Physical Exam: BP (!) 175/90   Pulse (!) 52   Temp (!) 97.1 F (36.2 C) (Temporal)   Resp 13   Ht '5\' 10"'$  (1.778 m)   Wt 83.9 kg   SpO2 100%   BMI 26.54 kg/m  General:   Alert,  pleasant and cooperative in NAD Head:  Normocephalic and atraumatic. Lungs:  Clear to auscultation.    Heart:  Regular rate and rhythm.   Impression/Plan: Jose Fowler is here for ophthalmic surgery.  Risks, benefits, limitations, and alternatives regarding ophthalmic surgery have been  reviewed with the patient.  Questions have been answered.  All parties agreeable.   Leandrew Koyanagi, MD  11/22/2021, 9:39 AM

## 2021-11-22 NOTE — Anesthesia Preprocedure Evaluation (Signed)
Anesthesia Evaluation  Patient identified by MRN, date of birth, ID band Patient awake    Reviewed: Allergy & Precautions, H&P , NPO status , Patient's Chart, lab work & pertinent test results, reviewed documented beta blocker date and time   Airway Mallampati: II  TM Distance: >3 FB Neck ROM: full    Dental no notable dental hx. (+) Teeth Intact   Pulmonary neg pulmonary ROS,    Pulmonary exam normal breath sounds clear to auscultation       Cardiovascular Exercise Tolerance: Good hypertension, negative cardio ROS   Rhythm:regular Rate:Normal     Neuro/Psych negative neurological ROS  negative psych ROS   GI/Hepatic negative GI ROS, Neg liver ROS,   Endo/Other  negative endocrine ROSdiabetes  Renal/GU      Musculoskeletal   Abdominal   Peds  Hematology negative hematology ROS (+)   Anesthesia Other Findings   Reproductive/Obstetrics negative OB ROS                             Anesthesia Physical Anesthesia Plan  ASA: 2  Anesthesia Plan: MAC   Post-op Pain Management:    Induction:   PONV Risk Score and Plan:   Airway Management Planned:   Additional Equipment:   Intra-op Plan:   Post-operative Plan:   Informed Consent: I have reviewed the patients History and Physical, chart, labs and discussed the procedure including the risks, benefits and alternatives for the proposed anesthesia with the patient or authorized representative who has indicated his/her understanding and acceptance.       Plan Discussed with: CRNA  Anesthesia Plan Comments:         Anesthesia Quick Evaluation

## 2021-11-23 ENCOUNTER — Encounter: Payer: Self-pay | Admitting: Ophthalmology

## 2022-07-30 ENCOUNTER — Other Ambulatory Visit: Payer: Self-pay | Admitting: "Endocrinology

## 2022-07-30 DIAGNOSIS — D497 Neoplasm of unspecified behavior of endocrine glands and other parts of nervous system: Secondary | ICD-10-CM

## 2022-08-07 ENCOUNTER — Ambulatory Visit
Admission: RE | Admit: 2022-08-07 | Discharge: 2022-08-07 | Disposition: A | Discharge status: Home / Self Care | Payer: Medicare Other | Source: Ambulatory Visit | Attending: "Endocrinology | Admitting: "Endocrinology

## 2022-08-07 DIAGNOSIS — D497 Neoplasm of unspecified behavior of endocrine glands and other parts of nervous system: Secondary | ICD-10-CM | POA: Insufficient documentation

## 2022-08-07 MED ORDER — GADOBUTROL 1 MMOL/ML IV SOLN
8.0000 mL | Freq: Once | INTRAVENOUS | Status: AC | PRN
  Administered 2022-08-07: 8 mL via INTRAVENOUS
